# Patient Record
Sex: Female | Born: 1965 | Race: Black or African American | Hispanic: No
Health system: Southern US, Community
[De-identification: ages and names within clinical notes are randomized; demographics above are authoritative.]

## PROBLEM LIST (undated history)

## (undated) DIAGNOSIS — F419 Anxiety disorder, unspecified: Secondary | ICD-10-CM

## (undated) DIAGNOSIS — J40 Bronchitis, not specified as acute or chronic: Secondary | ICD-10-CM

## (undated) DIAGNOSIS — I10 Essential (primary) hypertension: Secondary | ICD-10-CM

## (undated) HISTORY — PX: ABDOMINAL HYSTERECTOMY: SHX81

## (undated) HISTORY — PX: LEG SURGERY: SHX1003

## (undated) HISTORY — PX: HYSTERECTOMY: SHX81

---

## 2001-08-15 ENCOUNTER — Encounter (INDEPENDENT_AMBULATORY_CARE_PROVIDER_SITE_OTHER): Payer: Self-pay | Admitting: *Deleted

## 2001-08-15 ENCOUNTER — Ambulatory Visit (HOSPITAL_BASED_OUTPATIENT_CLINIC_OR_DEPARTMENT_OTHER): Admission: RE | Admit: 2001-08-15 | Discharge: 2001-08-15 | Payer: Self-pay | Admitting: General Surgery

## 2001-08-27 ENCOUNTER — Encounter: Payer: Self-pay | Admitting: Oncology

## 2001-08-27 ENCOUNTER — Ambulatory Visit (HOSPITAL_COMMUNITY): Admission: RE | Admit: 2001-08-27 | Discharge: 2001-08-27 | Payer: Self-pay | Admitting: Oncology

## 2001-08-28 ENCOUNTER — Ambulatory Visit: Admission: RE | Admit: 2001-08-28 | Discharge: 2001-11-26 | Payer: Self-pay | Admitting: Radiation Oncology

## 2001-08-29 ENCOUNTER — Ambulatory Visit (HOSPITAL_COMMUNITY): Admission: RE | Admit: 2001-08-29 | Discharge: 2001-08-29 | Payer: Self-pay | Admitting: Oncology

## 2001-08-29 ENCOUNTER — Encounter: Payer: Self-pay | Admitting: Oncology

## 2001-09-02 ENCOUNTER — Encounter: Payer: Self-pay | Admitting: Oncology

## 2001-09-02 ENCOUNTER — Ambulatory Visit (HOSPITAL_COMMUNITY): Admission: RE | Admit: 2001-09-02 | Discharge: 2001-09-02 | Payer: Self-pay | Admitting: Oncology

## 2001-09-04 ENCOUNTER — Encounter: Payer: Self-pay | Admitting: Oncology

## 2001-09-04 ENCOUNTER — Ambulatory Visit (HOSPITAL_COMMUNITY): Admission: RE | Admit: 2001-09-04 | Discharge: 2001-09-04 | Payer: Self-pay | Admitting: Oncology

## 2005-02-28 ENCOUNTER — Emergency Department (HOSPITAL_COMMUNITY): Admission: EM | Admit: 2005-02-28 | Discharge: 2005-02-28 | Payer: Self-pay | Admitting: Emergency Medicine

## 2006-01-24 ENCOUNTER — Emergency Department (HOSPITAL_COMMUNITY): Admission: EM | Admit: 2006-01-24 | Discharge: 2006-01-24 | Payer: Self-pay | Admitting: Emergency Medicine

## 2008-03-29 ENCOUNTER — Emergency Department (HOSPITAL_COMMUNITY): Admission: EM | Admit: 2008-03-29 | Discharge: 2008-03-29 | Payer: Self-pay | Admitting: Emergency Medicine

## 2009-05-04 ENCOUNTER — Emergency Department (HOSPITAL_COMMUNITY): Admission: EM | Admit: 2009-05-04 | Discharge: 2009-05-05 | Payer: Self-pay | Admitting: Emergency Medicine

## 2010-10-06 NOTE — Op Note (Signed)
[place]  Patient:    Christina Glass, Christina Glass Visit Number: [id number] MRN: [id number]          Service Type: DSU Location: [facility] Attending Physician:  Arlis Porta Dictated by:   Adolph Pollack, M.D. Proc. Date: 08/15/01 Admit Date:  08/15/2001   CC:         Moshe Salisbury. Audria Nine, M.D.   Operative Report  PREOPERATIVE DIAGNOSIS:  Left lower extremity soft tissue mass.  POSTOPERATIVE DIAGNOSIS:  Left lower extremity soft tissue mass.  PROCEDURE:  Excision of 4.5 cm left lower extremity soft tissue mass.  SURGEON:  Adolph Pollack, M.D.  ANESTHESIA:  General.  INDICATIONS:  The patient is a 45 year old female who first presented to my office in February with a left lower extremity soft tissue mass.  It had some bluish discoloration at that time.  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  I thought maybe a hematoma had come back in a month and it was still present, so now she presents for elective excision.  She has chosen to have general anesthesia.  DESCRIPTION OF PROCEDURE:  The left lower extremity lesion was noted and quite evident in the pretibial area.  She is brought to the operating room and a general anesthetic was administered.  The left lower extremity was sterilely prepped and draped.  Local anesthetic was infiltrated directly over the wound in the mid pretibial area on the left side.  An incision was made directly over the mass and carried down through the subcutaneous tissue.  A fleshy-type mass was identified and using careful blunt and sharp dissection, it was excised.  There appeared to be a nerve branch ending in the mass.  I divided this feeder nerve with the cautery and passed the mass off the field to be sent fresh to pathology.  I inspected the wound.  The tibialis anterior muscle was exposed.  Bleeding points were controlled with the cautery.  I then closed the wound in a single layer with a running  locking 4-0 nylon suture.  A sterile dressing was applied.  She tolerated the procedure well without any apparent complications and was taken to the recovery room in satisfactory condition.  We will await the pathology report and I will see her back in the office in about 10 days. Dictated by:   Adolph Pollack, M.D. Attending Physician:  Arlis Porta DD:  08/15/01 TD:  08/17/01 Job: [id number]

## 2011-01-14 ENCOUNTER — Emergency Department (HOSPITAL_BASED_OUTPATIENT_CLINIC_OR_DEPARTMENT_OTHER)
Admission: EM | Admit: 2011-01-14 | Discharge: 2011-01-14 | Disposition: A | Discharge status: Home / Self Care | Payer: Self-pay | Attending: Emergency Medicine | Admitting: Emergency Medicine

## 2011-01-14 ENCOUNTER — Encounter: Payer: Self-pay | Admitting: *Deleted

## 2011-01-14 DIAGNOSIS — F411 Generalized anxiety disorder: Secondary | ICD-10-CM | POA: Insufficient documentation

## 2011-01-14 MED ORDER — LORAZEPAM 1 MG PO TABS: 1.0000 mg | ORAL_TABLET | Freq: Three times a day (TID) | ORAL | Status: AC | PRN
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
## 2011-01-22 ENCOUNTER — Encounter (HOSPITAL_BASED_OUTPATIENT_CLINIC_OR_DEPARTMENT_OTHER): Payer: Self-pay | Admitting: *Deleted

## 2011-01-22 ENCOUNTER — Emergency Department (HOSPITAL_BASED_OUTPATIENT_CLINIC_OR_DEPARTMENT_OTHER)
Admission: EM | Admit: 2011-01-22 | Discharge: 2011-01-22 | Disposition: A | Discharge status: Home / Self Care | Payer: Self-pay | Attending: Emergency Medicine | Admitting: Emergency Medicine

## 2011-01-22 DIAGNOSIS — H9209 Otalgia, unspecified ear: Secondary | ICD-10-CM | POA: Insufficient documentation

## 2011-01-22 DIAGNOSIS — F411 Generalized anxiety disorder: Secondary | ICD-10-CM | POA: Insufficient documentation

## 2011-01-22 HISTORY — DX: Anxiety disorder, unspecified: F41.9

## 2011-01-22 MED ORDER — LORAZEPAM 1 MG PO TABS: 1.0000 mg | ORAL_TABLET | Freq: Three times a day (TID) | ORAL | Status: AC | PRN

## 2011-01-22 NOTE — ED Notes (Signed)
Pt. In no distress and will be discharged home alone.

## 2011-01-22 NOTE — ED Provider Notes (Signed)
History     CSN: [id number] Arrival date & time: 01/22/2011 10:21 PM  Chief Complaint  Patient presents with  . Ear Fullness   HPI Comments: States has been stressed out since death of mother last week.  Was here for "thumping in her ear" and anxiety last week.  Was given Ativan which helped her nerves and helped her sleep.  It also made the thumping in her ear go away.  She believes stress is causing her symptoms.  Patient is a 45 y.o. female presenting with plugged ear sensation. The history is provided by the patient.  Ear Fullness This is a recurrent problem. The current episode started more than 1 week ago. The problem occurs constantly. The problem has not changed since onset.Pertinent negatives include no chest pain, no abdominal pain, no headaches and no shortness of breath. The symptoms are aggravated by stress.    Past Medical History  Diagnosis Date  . Anxiety     History reviewed. No pertinent past surgical history.  No family history on file.  History  Substance Use Topics  . Smoking status: Never Smoker   . Smokeless tobacco: Not on file  . Alcohol Use: No    OB History    Grav Para Term Preterm Abortions TAB SAB Ect Mult Living                  Review of Systems  HENT: Positive for ear pain.   Respiratory: Negative for shortness of breath.   Cardiovascular: Negative for chest pain.  Gastrointestinal: Negative for abdominal pain.  Neurological: Negative for headaches.  All other systems reviewed and are negative.    Physical Exam  BP 130/70  Pulse 94  Temp(Src) 98.3 F (36.8 C) (Oral)  Resp 20  SpO2 98%  Physical Exam  Constitutional: She is oriented to person, place, and time. She appears well-developed and well-nourished. No distress.  HENT:  Head: Normocephalic and atraumatic.  Right Ear: External ear normal.  Left Ear: External ear normal.  Mouth/Throat: Oropharynx is clear and moist.  Eyes: EOM are normal. Pupils are equal, round, and  reactive to light.  Neck: Normal range of motion. Neck supple.  Cardiovascular: Normal rate and regular rhythm.  Exam reveals no gallop and no friction rub.   No murmur heard. Pulmonary/Chest: Effort normal and breath sounds normal. No respiratory distress. She has no wheezes.  Abdominal: Soft. Bowel sounds are normal.  Musculoskeletal: Normal range of motion.  Lymphadenopathy:    She has no cervical adenopathy.  Neurological: She is alert and oriented to person, place, and time.  Skin: Skin is warm and dry. She is not diaphoretic.  Psychiatric: She has a normal mood and affect.    ED Course  Procedures  MDM Symptoms relieved with ativan before.  Will prescribe small quantity of same.  Explained that this can be habit forming and not to overuse.      Geoffery Lyons, MD 01/22/11 2248

## 2011-01-22 NOTE — ED Notes (Signed)
No distress noted in Pt. °

## 2011-01-22 NOTE — ED Notes (Signed)
Thumping in her left ear. Was seen for same a week ago and symptoms were relieved by anxiety medication we gave her. She ran out of medication 2 days ago and symptoms have returned.

## 2011-02-20 LAB — URINE MICROSCOPIC-ADD ON

## 2011-02-20 LAB — URINE CULTURE

## 2011-02-20 LAB — URINALYSIS, ROUTINE W REFLEX MICROSCOPIC
Nitrite: NEGATIVE
Specific Gravity, Urine: 1.017
Urobilinogen, UA: 1

## 2011-06-05 ENCOUNTER — Encounter (HOSPITAL_BASED_OUTPATIENT_CLINIC_OR_DEPARTMENT_OTHER): Payer: Self-pay | Admitting: Emergency Medicine

## 2011-06-05 ENCOUNTER — Emergency Department (INDEPENDENT_AMBULATORY_CARE_PROVIDER_SITE_OTHER): Payer: Self-pay

## 2011-06-05 ENCOUNTER — Emergency Department (HOSPITAL_BASED_OUTPATIENT_CLINIC_OR_DEPARTMENT_OTHER)
Admission: EM | Admit: 2011-06-05 | Discharge: 2011-06-05 | Disposition: A | Discharge status: Home / Self Care | Payer: Self-pay | Attending: Emergency Medicine | Admitting: Emergency Medicine

## 2011-06-05 DIAGNOSIS — R0602 Shortness of breath: Secondary | ICD-10-CM | POA: Insufficient documentation

## 2011-06-05 DIAGNOSIS — J069 Acute upper respiratory infection, unspecified: Secondary | ICD-10-CM | POA: Insufficient documentation

## 2011-06-05 DIAGNOSIS — R05 Cough: Secondary | ICD-10-CM

## 2011-06-05 DIAGNOSIS — R509 Fever, unspecified: Secondary | ICD-10-CM

## 2011-06-05 DIAGNOSIS — R51 Headache: Secondary | ICD-10-CM | POA: Insufficient documentation

## 2011-06-05 DIAGNOSIS — I517 Cardiomegaly: Secondary | ICD-10-CM

## 2011-06-05 DIAGNOSIS — R911 Solitary pulmonary nodule: Secondary | ICD-10-CM

## 2011-06-05 MED ORDER — BENZONATATE 100 MG PO CAPS: 100.0000 mg | ORAL_CAPSULE | Freq: Three times a day (TID) | ORAL | Status: AC

## 2011-06-05 NOTE — ED Provider Notes (Signed)
History     CSN: [id number]  Arrival date & time 06/05/11  2136   First MD Initiated Contact with Patient 06/05/11 2201      Chief Complaint  Patient presents with  . Cough  . Shortness of Breath  . Fever    (Consider location/radiation/quality/duration/timing/severity/associated sxs/prior treatment) HPI Comments: Pt with cough, runny nose, myalgia for 4 days.  Cough worsening.  No SOB.  Sore across chest from coughing  Patient is a 46 y.o. female presenting with cough, shortness of breath, and fever. The history is provided by the patient.  Cough This is a new problem. The current episode started more than 2 days ago. The problem occurs constantly. The problem has been gradually worsening. The cough is productive of sputum. Maximum temperature: subjective fever. Associated symptoms include ear congestion, headaches, rhinorrhea, sore throat and myalgias. Pertinent negatives include no chest pain, no chills and no shortness of breath. She has tried cough syrup for the symptoms. The treatment provided no relief. She is not a smoker.  Shortness of Breath  Associated symptoms include a fever, rhinorrhea, sore throat and cough. Pertinent negatives include no chest pain and no shortness of breath.  Fever Primary symptoms of the febrile illness include fever, headaches, cough and myalgias. Primary symptoms do not include fatigue, shortness of breath, abdominal pain, nausea, vomiting, diarrhea, arthralgias or rash.  The headache is not associated with weakness.  The myalgias are not associated with weakness.    Past Medical History  Diagnosis Date  . Anxiety     Past Surgical History  Procedure Date  . Leg surgery     No family history on file.  History  Substance Use Topics  . Smoking status: Never Smoker   . Smokeless tobacco: Not on file  . Alcohol Use: No    OB History    Grav Para Term Preterm Abortions TAB SAB Ect Mult Living                  Review of Systems    Constitutional: Positive for fever. Negative for chills, diaphoresis and fatigue.  HENT: Positive for sore throat, rhinorrhea and postnasal drip. Negative for congestion and sneezing.   Eyes: Negative.   Respiratory: Positive for cough. Negative for chest tightness and shortness of breath.   Cardiovascular: Negative for chest pain and leg swelling.  Gastrointestinal: Negative for nausea, vomiting, abdominal pain, diarrhea and blood in stool.  Genitourinary: Negative for frequency, hematuria, flank pain and difficulty urinating.  Musculoskeletal: Positive for myalgias. Negative for back pain and arthralgias.  Skin: Negative for rash.  Neurological: Positive for headaches. Negative for dizziness, speech difficulty, weakness and numbness.    Allergies  Ivp dye; Fish oil; and Shrimp  Home Medications   Current Outpatient Rx  Name Route Sig Dispense Refill  . PHENYLEPHRINE-APAP-GUAIFENESIN 10-650-400 MG/20ML PO LIQD Oral Take 15 mLs by mouth every 4 (four) hours as needed. For congestion    . BENZONATATE 100 MG PO CAPS Oral Take 1 capsule (100 mg total) by mouth every 8 (eight) hours. 21 capsule 0    BP 128/78  Pulse 99  Temp(Src) 99.6 F (37.6 C) (Oral)  Resp 18  Physical Exam  Constitutional: She is oriented to person, place, and time. She appears well-developed and well-nourished.  HENT:  Head: Normocephalic and atraumatic.  Right Ear: External ear normal.  Left Ear: External ear normal.  Nose: Nose normal.  Mouth/Throat: Oropharynx is clear and moist.  Eyes: Pupils are equal, round,  and reactive to light.  Neck: Normal range of motion. Neck supple.  Cardiovascular: Normal rate, regular rhythm and normal heart sounds.   Pulmonary/Chest: Effort normal and breath sounds normal. No respiratory distress. She has no wheezes. She has no rales. She exhibits no tenderness.  Abdominal: Soft. Bowel sounds are normal. There is no tenderness. There is no rebound and no guarding.   Musculoskeletal: Normal range of motion. She exhibits no edema.  Lymphadenopathy:    She has no cervical adenopathy.  Neurological: She is alert and oriented to person, place, and time.  Skin: Skin is warm and dry. No rash noted.  Psychiatric: She has a normal mood and affect.    ED Course  Procedures (including critical care time)  Dg Chest 2 View  06/05/2011  *RADIOLOGY REPORT*  Clinical Data: Cough, fever  CHEST - 2 VIEW  Comparison: 05/04/2009  Findings: Mild cardiomegaly without CHF, pneumonia, edema, effusion, collapse, consolidation, pneumothorax.  Stable focal right upper lobe sub centimeter nodular density, suspect prominent vascular marking versus small granuloma.  Trachea is midline.  IMPRESSION: Cardiomegaly without CHF or pneumonia.  Original Report Authenticated By: Judie Petit. Ruel Favors, M.D.       1. URI (upper respiratory infection)       MDM  Pt with URI, no evidence of pneumonia.  No hypoxia.  Nothing to suggest PE        Rolan Bucco, MD 06/05/11 2233

## 2011-06-05 NOTE — ED Notes (Signed)
Pt c/o shob, cough, fever since sun

## 2011-06-05 NOTE — ED Notes (Signed)
Pt also reports headache and sore throat

## 2011-07-03 ENCOUNTER — Emergency Department (INDEPENDENT_AMBULATORY_CARE_PROVIDER_SITE_OTHER): Payer: Self-pay

## 2011-07-03 ENCOUNTER — Emergency Department (HOSPITAL_BASED_OUTPATIENT_CLINIC_OR_DEPARTMENT_OTHER)
Admission: EM | Admit: 2011-07-03 | Discharge: 2011-07-03 | Disposition: A | Discharge status: Home / Self Care | Payer: Self-pay | Attending: Emergency Medicine | Admitting: Emergency Medicine

## 2011-07-03 ENCOUNTER — Encounter (HOSPITAL_BASED_OUTPATIENT_CLINIC_OR_DEPARTMENT_OTHER): Payer: Self-pay | Admitting: Emergency Medicine

## 2011-07-03 DIAGNOSIS — R059 Cough, unspecified: Secondary | ICD-10-CM | POA: Insufficient documentation

## 2011-07-03 DIAGNOSIS — R51 Headache: Secondary | ICD-10-CM

## 2011-07-03 DIAGNOSIS — R05 Cough: Secondary | ICD-10-CM

## 2011-07-03 DIAGNOSIS — J40 Bronchitis, not specified as acute or chronic: Secondary | ICD-10-CM | POA: Insufficient documentation

## 2011-07-03 MED ORDER — ALBUTEROL SULFATE HFA 108 (90 BASE) MCG/ACT IN AERS
2.0000 | INHALATION_SPRAY | RESPIRATORY_TRACT | Status: DC | PRN
  Administered 2011-07-03: 2 via RESPIRATORY_TRACT
  Filled 2011-07-03: qty 6.7

## 2011-07-03 MED ORDER — HYDROCOD POLST-CHLORPHEN POLST 10-8 MG/5ML PO LQCR: ORAL | Status: DC

## 2011-07-03 MED ORDER — AZITHROMYCIN 250 MG PO TABS: ORAL_TABLET | ORAL | Status: DC

## 2011-07-03 NOTE — Discharge Instructions (Signed)
Bronchitis Bronchitis is the body's way of reacting to injury and/or infection (inflammation) of the bronchi. Bronchi are the air tubes that extend from the windpipe into the lungs. If the inflammation becomes severe, it may cause shortness of breath. CAUSES  Inflammation may be caused by:  A virus.   Germs (bacteria).   Dust.   Allergens.   Pollutants and many other irritants.  The cells lining the bronchial tree are covered with tiny hairs (cilia). These constantly beat upward, away from the lungs, toward the mouth. This keeps the lungs free of pollutants. When these cells become too irritated and are unable to do their job, mucus begins to develop. This causes the characteristic cough of bronchitis. The cough clears the lungs when the cilia are unable to do their job. Without either of these protective mechanisms, the mucus would settle in the lungs. Then you would develop pneumonia. Smoking is a common cause of bronchitis and can contribute to pneumonia. Stopping this habit is the single most important thing you can do to help yourself. TREATMENT   Your caregiver may prescribe an antibiotic if the cough is caused by bacteria. Also, medicines that open up your airways make it easier to breathe. Your caregiver may also recommend or prescribe an expectorant. It will loosen the mucus to be coughed up. Only take over-the-counter or prescription medicines for pain, discomfort, or fever as directed by your caregiver.   Removing whatever causes the problem (smoking, for example) is critical to preventing the problem from getting worse.   Cough suppressants may be prescribed for relief of cough symptoms.   Inhaled medicines may be prescribed to help with symptoms now and to help prevent problems from returning.   For those with recurrent (chronic) bronchitis, there may be a need for steroid medicines.  SEEK IMMEDIATE MEDICAL CARE IF:   During treatment, you develop more pus-like mucus  (purulent sputum).   You have a fever.   You become progressively more ill.   You have increased difficulty breathing, wheezing, or shortness of breath.   MAKE SURE YOU:   Understand these instructions.   Will watch your condition.   Will get help right away if you are not doing well or get worse.  Document Released: 05/07/2005 Document Revised: 01/17/2011 Document Reviewed: 03/16/2008 [facility] Patient Information 2012 [place], [place].

## 2011-07-03 NOTE — ED Notes (Signed)
Pt c/o cough since 06/05/11.

## 2011-07-03 NOTE — ED Provider Notes (Signed)
History     CSN: [id number]  Arrival date & time 07/03/11  0038   First MD Initiated Contact with Patient 07/03/11 0058      Chief Complaint  Patient presents with  . Cough    (Consider location/radiation/quality/duration/timing/severity/associated sxs/prior treatment) HPI This is a 46 year old white female with about a one-month history of cough. She was treated on January 15 with Tessalon Perles. Her other URI symptoms at that time have resolved but the cough lingers. It is worse at night. She describes it as moderate to severe. There's been no posttussive emesis. She is not having any fevers. The cough is rarely productive. She is taking over-the-counter phenylephrine guaifenesin without relief. She states she sometimes wheezes at night. She has never used an inhaler.  Past Medical History  Diagnosis Date  . Anxiety     Past Surgical History  Procedure Date  . Leg surgery     No family history on file.  History  Substance Use Topics  . Smoking status: Never Smoker   . Smokeless tobacco: Not on file  . Alcohol Use: No    OB History    Grav Para Term Preterm Abortions TAB SAB Ect Mult Living                  Review of Systems  All other systems reviewed and are negative.    Allergies  Ivp dye; Fish oil; and Shrimp  Home Medications   Current Outpatient Rx  Name Route Sig Dispense Refill  . PHENYLEPHRINE-APAP-GUAIFENESIN 10-650-400 MG/20ML PO LIQD Oral Take 15 mLs by mouth every 4 (four) hours as needed. For congestion      BP 145/84  Pulse 96  Temp(Src) 98.1 F (36.7 C) (Oral)  Resp 18  Ht 5\' 3"  (1.6 m)  Wt 250 lb (113.399 kg)  BMI 44.29 kg/m2  SpO2 99%  Physical Exam General: Well-developed, well-nourished female in no acute distress; appearance consistent with age of record HENT: normocephalic, atraumatic Eyes: pupils equal round and reactive to light; extraocular muscles intact Neck: supple Heart: regular rate and rhythm Lungs: clear to  auscultation bilaterally; constant dry cough Abdomen: soft; nondistended; nontender Extremities: No deformity; full range of motion Neurologic: Awake, alert and oriented; motor function intact in all extremities and symmetric; no facial droop Skin: Warm and dry; multiple scattered neurofibromas Psychiatric: Normal mood and affect    ED Course  Procedures (including critical care time)     MDM   Nursing notes and vitals signs, including pulse oximetry, reviewed.  Summary of this visit's results, reviewed by myself:  Imaging Studies: Dg Chest 2 View  07/03/2011  *RADIOLOGY REPORT*  Clinical Data: Persistent cough and headache.  CHEST - 2 VIEW  Comparison: 06/05/2011.  Findings: The heart is mildly enlarged.  The lungs are clear.  The visualized soft tissues and bony thorax are unremarkable.  IMPRESSION: Negative chest.  Original Report Authenticated By: Jamesetta Orleans. MATTERN, M.D.   1:56 AM Cough improved after albuterol treatment.        Hanley Seamen, MD 07/03/11 [phone]

## 2011-09-15 ENCOUNTER — Encounter (HOSPITAL_BASED_OUTPATIENT_CLINIC_OR_DEPARTMENT_OTHER): Payer: Self-pay | Admitting: *Deleted

## 2011-09-15 ENCOUNTER — Emergency Department (HOSPITAL_BASED_OUTPATIENT_CLINIC_OR_DEPARTMENT_OTHER)
Admission: EM | Admit: 2011-09-15 | Discharge: 2011-09-15 | Disposition: A | Discharge status: Home / Self Care | Payer: Self-pay | Attending: Emergency Medicine | Admitting: Emergency Medicine

## 2011-09-15 DIAGNOSIS — L309 Dermatitis, unspecified: Secondary | ICD-10-CM

## 2011-09-15 DIAGNOSIS — L259 Unspecified contact dermatitis, unspecified cause: Secondary | ICD-10-CM | POA: Insufficient documentation

## 2011-09-15 MED ORDER — DIPHENHYDRAMINE HCL 50 MG/ML IJ SOLN
50.0000 mg | Freq: Once | INTRAMUSCULAR | Status: AC
  Administered 2011-09-15: 50 mg via INTRAMUSCULAR
  Filled 2011-09-15: qty 1

## 2011-09-15 MED ORDER — HYDROXYZINE HCL 25 MG PO TABS: 25.0000 mg | ORAL_TABLET | Freq: Four times a day (QID) | ORAL | Status: AC

## 2011-09-15 MED ORDER — PREDNISONE 10 MG PO TABS
60.0000 mg | ORAL_TABLET | Freq: Once | ORAL | Status: AC
  Administered 2011-09-15: 60 mg via ORAL
  Filled 2011-09-15: qty 1

## 2011-09-15 MED ORDER — PREDNISONE 50 MG PO TABS: 50.0000 mg | ORAL_TABLET | Freq: Every day | ORAL | Status: AC

## 2011-09-15 NOTE — ED Provider Notes (Signed)
History   This chart was scribed for Celene Kras, MD by Charolett Bumpers . The patient was seen in room MH05/MH05 and the patient's care was started at 5:43pm.    CSN: [id number]  Arrival date & time 09/15/11  1726   First MD Initiated Contact with Patient 09/15/11 1741      Chief Complaint  Patient presents with  . Rash     HPI Christina Glass is a 46 y.o. female who presents to the Emergency Department complaining of constant, moderate rash on her lower right extremity. Patient reports associated itching and redness. Patient states that she took Benadryl orally as well as used a Benadryl cream and cortizone cream with no relief. Patient states that the redness started today. Patient states that her symptoms are worsening. Patient denies any new detergents. Patient notes that she received a pedicure prior to the symptoms onset. No other symptoms reported. No pertinent medical history reported.    Past Medical History  Diagnosis Date  . Anxiety   . Neurofibromatosis     Past Surgical History  Procedure Date  . Leg surgery     History reviewed. No pertinent family history.  History  Substance Use Topics  . Smoking status: Never Smoker   . Smokeless tobacco: Not on file  . Alcohol Use: No    OB History    Grav Para Term Preterm Abortions TAB SAB Ect Mult Living                  Review of Systems  Constitutional: Negative for fever.  HENT: Negative for congestion and sore throat.   Respiratory: Negative for cough and shortness of breath.   Skin: Positive for rash.    Allergies  Ivp dye; Fish oil; and Shrimp  Home Medications   Current Outpatient Rx  Name Route Sig Dispense Refill  . AZITHROMYCIN 250 MG PO TABS  Take 2 tablets today then one tablet daily for 4 days. 6 tablet 0  . HYDROCOD POLST-CPM POLST ER 10-8 MG/5ML PO LQCR  Take 5 mL every 12 hours as needed for cough. 200 mL 0  . PHENYLEPHRINE-APAP-GUAIFENESIN 10-650-400 MG/20ML PO LIQD Oral Take  15 mLs by mouth every 4 (four) hours as needed. For congestion      BP 144/80  Pulse 83  Temp(Src) 98.1 F (36.7 C) (Oral)  Resp 16  Ht 5\' 2"  (1.575 m)  Wt 190 lb (86.183 kg)  BMI 34.75 kg/m2  SpO2 100%  Physical Exam  Nursing note and vitals reviewed. Constitutional: She appears well-developed and well-nourished. No distress.  HENT:  Head: Normocephalic and atraumatic.  Right Ear: External ear normal.  Left Ear: External ear normal.  Eyes: Conjunctivae are normal. Right eye exhibits no discharge. Left eye exhibits no discharge. No scleral icterus.  Neck: Neck supple. No tracheal deviation present.  Cardiovascular: Normal rate, regular rhythm and normal heart sounds.   Pulmonary/Chest: Effort normal and breath sounds normal. No stridor. No respiratory distress. She has no wheezes.  Musculoskeletal: She exhibits no edema.       neurofibromatosis lesions noted on extrem, face  Neurological: She is alert. Cranial nerve deficit: no gross deficits.  Skin: Skin is warm and dry. Rash noted. No purpura noted. Rash is macular. Rash is not pustular. There is erythema.       RLE with erythematous macular rash. Few areas with serous drainage. Excursions noted.    Psychiatric: She has a normal mood and affect.  ED Course  Procedures (including critical care time)  Medications  hydrOXYzine (ATARAX/VISTARIL) 25 MG tablet (not administered)  predniSONE (DELTASONE) 50 MG tablet (not administered)  diphenhydrAMINE (BENADRYL) injection 50 mg (50 mg Intramuscular Given 09/15/11 1801)  predniSONE (DELTASONE) tablet 60 mg (60 mg Oral Given 09/15/11 1802)    DIAGNOSTIC STUDIES: Oxygen Saturation is 100% on room air, normal by my interpretation.    COORDINATION OF CARE:  1747: Discussed planned course of treatment with the patient who is agreeable at this time.  1800: Medication Orders: Prednisone (Deltasone) tablet 60 mg-once; Diphenhydramine (Benadryl) injection 50 mg-once.   Labs  Reviewed - No data to display No results found.  1. Dermatitis     MDM  Doubt cellulitis.  Pt did get a pedicure prior to this happening.  There seems to be a demargination line by her ankle.  Suspect some type of contact dermatitis.  Will dc home on steroids and antihistamines.  Explained to patient to monitor for fever, pain. I personally performed the services described in this documentation, which was scribed in my presence.  The recorded information has been reviewed and considered.      Celene Kras, MD 09/15/11 [phone]

## 2011-09-15 NOTE — ED Notes (Signed)
Pt states she noticed her right lower ext itching yesterday and it has gotten progressively worse.

## 2011-09-15 NOTE — Discharge Instructions (Signed)
Contact Dermatitis  Contact dermatitis is a rash that happens when something touches the skin. You touched something that irritates your skin, or you have allergies to something you touched.  HOME CARE    Avoid the thing that caused your rash.   Keep your rash away from hot water, soap, sunlight, chemicals, and other things that might bother it.   Do not scratch your rash.   You can take cool baths to help stop itching.   Only take medicine as told by your doctor.   Keep all doctor visits as told.  GET HELP RIGHT AWAY IF:    Your rash is not better after 3 days.   Your rash gets worse.   Your rash is puffy (swollen), tender, red, sore, or warm.   You have problems with your medicine.  MAKE SURE YOU:    Understand these instructions.   Will watch your condition.   Will get help right away if you are not doing well or get worse.  Document Released: 03/04/2009 Document Revised: 04/26/2011 Document Reviewed: 10/10/2010  Exit[employer] Patient Information 2012 [employer].

## 2011-10-11 ENCOUNTER — Encounter (HOSPITAL_BASED_OUTPATIENT_CLINIC_OR_DEPARTMENT_OTHER): Payer: Self-pay | Admitting: Family Medicine

## 2011-10-11 ENCOUNTER — Emergency Department (HOSPITAL_BASED_OUTPATIENT_CLINIC_OR_DEPARTMENT_OTHER)
Admission: EM | Admit: 2011-10-11 | Discharge: 2011-10-11 | Disposition: A | Discharge status: Home / Self Care | Payer: Self-pay | Attending: Emergency Medicine | Admitting: Emergency Medicine

## 2011-10-11 DIAGNOSIS — L509 Urticaria, unspecified: Secondary | ICD-10-CM | POA: Insufficient documentation

## 2011-10-11 DIAGNOSIS — R21 Rash and other nonspecific skin eruption: Secondary | ICD-10-CM | POA: Insufficient documentation

## 2011-10-11 DIAGNOSIS — L298 Other pruritus: Secondary | ICD-10-CM | POA: Insufficient documentation

## 2011-10-11 DIAGNOSIS — L2989 Other pruritus: Secondary | ICD-10-CM | POA: Insufficient documentation

## 2011-10-11 MED ORDER — HYDROXYZINE HCL 25 MG PO TABS: 25.0000 mg | ORAL_TABLET | Freq: Four times a day (QID) | ORAL | Status: DC

## 2011-10-11 MED ORDER — PREDNISONE 10 MG PO TABS: 40.0000 mg | ORAL_TABLET | Freq: Every day | ORAL | Status: DC

## 2011-10-11 MED ORDER — HYDROXYZINE HCL 25 MG PO TABS: 25.0000 mg | ORAL_TABLET | Freq: Four times a day (QID) | ORAL | Status: AC

## 2011-10-11 MED ORDER — DEXAMETHASONE SODIUM PHOSPHATE 10 MG/ML IJ SOLN
10.0000 mg | Freq: Once | INTRAMUSCULAR | Status: AC
  Administered 2011-10-11: 10 mg via INTRAMUSCULAR
  Filled 2011-10-11: qty 1

## 2011-10-11 MED ORDER — DIPHENHYDRAMINE HCL 50 MG/ML IJ SOLN
50.0000 mg | Freq: Once | INTRAMUSCULAR | Status: AC
  Administered 2011-10-11: 50 mg via INTRAMUSCULAR
  Filled 2011-10-11: qty 1

## 2011-10-11 NOTE — ED Provider Notes (Signed)
History     CSN: [id number]  Arrival date & time 10/11/11  1139   First MD Initiated Contact with Patient 10/11/11 1156      Chief Complaint  Patient presents with  . Rash    (Consider location/radiation/quality/duration/timing/severity/associated sxs/prior treatment) HPI Comments: Patient presents with a pruritic rash on her chest and abdomen. That started 4 days ago, but got rapidly worse yesterday. She denies any new exposures. Denies any recent new medications. She has allergies to IVP dye and seafood. However, denies any other allergies or allergic reactions. She denies a facial swelling or swelling of her lips or tongue. Denies any shortness of breath. She's been taking Benadryl with no relief. She had a similar type rash on her legs about a month ago and was seen here in the emergency department and prescribed Atarax and steroids. She did improve after that and then the rash just started back again. A couple days ago.  Patient is a 46 y.o. female presenting with rash. The history is provided by the patient.  Rash  This is a new problem. The current episode started yesterday. The problem has been gradually worsening. The problem is associated with nothing. There has been no fever. The rash is present on the trunk. The patient is experiencing no pain. Associated symptoms include itching. Pertinent negatives include no blisters.    Past Medical History  Diagnosis Date  . Anxiety   . Neurofibromatosis     Past Surgical History  Procedure Date  . Leg surgery     No family history on file.  History  Substance Use Topics  . Smoking status: Never Smoker   . Smokeless tobacco: Not on file  . Alcohol Use: No    OB History    Grav Para Term Preterm Abortions TAB SAB Ect Mult Living                  Review of Systems  Constitutional: Negative for fever, chills, diaphoresis and fatigue.  HENT: Negative for congestion, rhinorrhea and sneezing.   Eyes: Negative.     Respiratory: Negative for cough, chest tightness and shortness of breath.   Cardiovascular: Negative for chest pain and leg swelling.  Gastrointestinal: Negative for nausea, vomiting, abdominal pain, diarrhea and blood in stool.  Genitourinary: Negative for frequency, hematuria, flank pain and difficulty urinating.  Musculoskeletal: Negative for back pain and arthralgias.  Skin: Positive for itching and rash.  Neurological: Negative for dizziness, speech difficulty, weakness, numbness and headaches.    Allergies  Ivp dye; Fish oil; and Shrimp  Home Medications   Current Outpatient Rx  Name Route Sig Dispense Refill  . BENADRYL ALLERGY PO Oral Take by mouth.    Marland Kitchen HYDROXYZINE HCL 25 MG PO TABS Oral Take 1 tablet (25 mg total) by mouth every 6 (six) hours. 12 tablet 0  . PREDNISONE 10 MG PO TABS Oral Take 4 tablets (40 mg total) by mouth daily. 20 tablet 0    BP 126/84  Pulse 84  Temp(Src) 98.4 F (36.9 C) (Oral)  Resp 16  SpO2 97%  Physical Exam  Constitutional: She is oriented to person, place, and time. She appears well-developed and well-nourished.  HENT:  Head: Normocephalic and atraumatic.  Eyes: Pupils are equal, round, and reactive to light.  Neck: Normal range of motion. Neck supple.  Cardiovascular: Normal rate, regular rhythm and normal heart sounds.   Pulmonary/Chest: Effort normal and breath sounds normal. No respiratory distress. She has no wheezes. She has  no rales. She exhibits no tenderness.  Abdominal: Soft. Bowel sounds are normal. There is no tenderness. There is no rebound and no guarding.  Musculoskeletal: Normal range of motion. She exhibits no edema.  Lymphadenopathy:    She has no cervical adenopathy.  Neurological: She is alert and oriented to person, place, and time.  Skin: Skin is warm and dry. No rash noted.       Urticarial type rash on the chest and abdomen. It is blanching. There is no vesicular lesions. No petechiae or purpura.  Psychiatric:  She has a normal mood and affect.    ED Course  Procedures (including critical care time)  Labs Reviewed - No data to display No results found.   1. Urticaria       MDM  Pt with allergic appearing rash.  No airway involvement.  No signs of infection.  Will give steroids/atarax.  Encouraged f/u with dermatologist        Rolan Bucco, MD 10/11/11 1245

## 2011-10-11 NOTE — ED Notes (Signed)
Pt c/o rash "all over" and itching x 4 days. Pt reports h/o similar rash.

## 2011-10-11 NOTE — Discharge Instructions (Signed)
Hives Hives (urticaria) are itchy, red, swollen patches on the skin. They may change size, shape, and location quickly and repeatedly. Hives that occur deeper in the skin can cause swelling of the hands, feet, and face. Hives may be an allergic reaction to something you or your child ate, touched, or put on the skin. Hives can also be a reaction to cold, heat, viral infections, medication, insect bites, or emotional stress. Often the cause is hard to find. Hives can come and go for several days to several weeks. Hives are not contagious. HOME CARE INSTRUCTIONS   If the cause of the hives is known, avoid exposure to that source.   To relieve itching and rash:   Apply cold compresses to the skin or take cool water baths. Do not take or give your child hot baths or showers because the warmth will make the itching worse.   The best medicine for hives is an antihistamine. An antihistamine will not cure hives, but it will reduce their severity. You can use an antihistamine available over the counter. This medicine may make your child sleepy. Teenagers should not drive while using this medicine.   Take or give an antihistamine every 6 hours until the hives are completely gone for 24 hours or as directed.   Your child may have other medications prescribed for itching. Give these as directed by your child's caregiver.   You or your child should wear loose fitting clothing, including undergarments. Skin irritations may make hives worse.   Follow-up as directed by your caregiver.  SEEK MEDICAL CARE IF:   You or your child still have considerable itching after taking the medication (prescribed or purchased over the counter).   Joint swelling or pain occurs.  SEEK IMMEDIATE MEDICAL CARE IF:   You have a fever.   Swollen lips or tongue are noticed.   There is difficulty with breathing, swallowing, or tightness in the throat or chest.   Abdominal pain develops.   Your child starts acting very  sick.  These may be the first signs of a life-threatening allergic reaction. THIS IS AN EMERGENCY. Call 911 for medical help. MAKE SURE YOU:   Understand these instructions.   Will watch your condition.   Will get help right away if you are not doing well or get worse.  Document Released: 05/07/2005 Document Revised: 04/26/2011 Document Reviewed: 12/26/2007 [employer] Patient Information 2012 [employer]. 

## 2012-01-24 ENCOUNTER — Emergency Department (HOSPITAL_BASED_OUTPATIENT_CLINIC_OR_DEPARTMENT_OTHER)
Admission: EM | Admit: 2012-01-24 | Discharge: 2012-01-24 | Disposition: A | Discharge status: Home / Self Care | Payer: Self-pay | Attending: Emergency Medicine | Admitting: Emergency Medicine

## 2012-01-24 ENCOUNTER — Encounter (HOSPITAL_BASED_OUTPATIENT_CLINIC_OR_DEPARTMENT_OTHER): Payer: Self-pay | Admitting: *Deleted

## 2012-01-24 ENCOUNTER — Emergency Department (HOSPITAL_BASED_OUTPATIENT_CLINIC_OR_DEPARTMENT_OTHER): Payer: Self-pay

## 2012-01-24 DIAGNOSIS — M25519 Pain in unspecified shoulder: Secondary | ICD-10-CM | POA: Insufficient documentation

## 2012-01-24 DIAGNOSIS — S46912A Strain of unspecified muscle, fascia and tendon at shoulder and upper arm level, left arm, initial encounter: Secondary | ICD-10-CM

## 2012-01-24 MED ORDER — IBUPROFEN 800 MG PO TABS: 800.0000 mg | ORAL_TABLET | Freq: Three times a day (TID) | ORAL | Status: AC

## 2012-01-24 MED ORDER — HYDROCODONE-ACETAMINOPHEN 5-325 MG PO TABS: 2.0000 | ORAL_TABLET | ORAL | Status: AC | PRN

## 2012-01-24 MED ORDER — HYDROCODONE-ACETAMINOPHEN 5-325 MG PO TABS
2.0000 | ORAL_TABLET | Freq: Once | ORAL | Status: AC
  Administered 2012-01-24: 2 via ORAL
  Filled 2012-01-24: qty 2

## 2012-01-24 NOTE — ED Notes (Signed)
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

## 2012-01-24 NOTE — ED Provider Notes (Signed)
History     CSN: [id number]  Arrival date & time 01/24/12  1628   First MD Initiated Contact with Patient 01/24/12 1655      Chief Complaint  Patient presents with  . Shoulder Pain    (Consider location/radiation/quality/duration/timing/severity/associated sxs/prior treatment) Patient is a 46 y.o. female presenting with shoulder injury. The history is provided by the patient. No language interpreter was used.  Shoulder Injury This is a new problem. Episode onset: 3 days ago. The problem occurs constantly. The problem has been gradually worsening. Associated symptoms include joint swelling. Nothing aggravates the symptoms. She has tried nothing for the symptoms. The treatment provided moderate relief.  Pt reports she injured her shoulder 3 days ago while lifting a pt.  Pt complains of pain with lifting  Past Medical History  Diagnosis Date  . Anxiety   . Neurofibromatosis     Past Surgical History  Procedure Date  . Leg surgery     History reviewed. No pertinent family history.  History  Substance Use Topics  . Smoking status: Never Smoker   . Smokeless tobacco: Not on file  . Alcohol Use: No    OB History    Grav Para Term Preterm Abortions TAB SAB Ect Mult Living                  Review of Systems  Musculoskeletal: Positive for joint swelling.  All other systems reviewed and are negative.    Allergies  Ivp dye; Fish oil; and Shrimp  Home Medications  No current outpatient prescriptions on file.  BP 124/70  Pulse 72  Temp 98.4 F (36.9 C) (Oral)  Resp 20  SpO2 100%  Physical Exam  Nursing note and vitals reviewed. Constitutional: She is oriented to person, place, and time. She appears well-developed and well-nourished.  HENT:  Head: Normocephalic and atraumatic.  Neck: Normal range of motion. Neck supple.  Cardiovascular: Normal rate.   Pulmonary/Chest: Effort normal.  Musculoskeletal: She exhibits tenderness.  Neurological: She is alert and  oriented to person, place, and time. She has normal reflexes.  Skin: Skin is warm.  Psychiatric: She has a normal mood and affect.    ED Course  Procedures (including critical care time)  Labs Reviewed - No data to display Dg Shoulder Right  01/24/2012  *RADIOLOGY REPORT*  Clinical Data: Right shoulder pain  RIGHT SHOULDER - 2+ VIEW  Comparison: None.  Findings: Normal alignment without fracture.  AC joint aligned. Visualized right ribs intact.  Right lung clear.  IMPRESSION: No acute finding.   Original Report Authenticated By: Judie Petit. Ruel Favors, M.D.      No diagnosis found.    MDM  Pt given 2 hydrocodone for discomfort.  I advised call Dr. Pearletha Forge to be seen for evaluation Pt given RX for hydrocodone and ibuprofen       Lonia Skinner [place], [place] 01/24/12 1750

## 2012-01-26 NOTE — ED Provider Notes (Signed)
Medical screening examination/treatment/procedure(s) were performed by non-physician practitioner and as supervising physician I was immediately available for consultation/collaboration.   Cyndra Numbers, MD 01/26/12 [phone]

## 2012-01-30 ENCOUNTER — Encounter (HOSPITAL_BASED_OUTPATIENT_CLINIC_OR_DEPARTMENT_OTHER): Payer: Self-pay | Admitting: Emergency Medicine

## 2012-01-30 ENCOUNTER — Emergency Department (HOSPITAL_BASED_OUTPATIENT_CLINIC_OR_DEPARTMENT_OTHER)
Admission: EM | Admit: 2012-01-30 | Discharge: 2012-01-30 | Disposition: A | Discharge status: Home / Self Care | Payer: Self-pay | Attending: Emergency Medicine | Admitting: Emergency Medicine

## 2012-01-30 DIAGNOSIS — B029 Zoster without complications: Secondary | ICD-10-CM | POA: Insufficient documentation

## 2012-01-30 MED ORDER — HYDROCODONE-ACETAMINOPHEN 5-325 MG PO TABS: 1.0000 | ORAL_TABLET | Freq: Four times a day (QID) | ORAL | Status: AC | PRN

## 2012-01-30 MED ORDER — ACYCLOVIR 800 MG PO TABS: ORAL_TABLET | ORAL | Status: DC

## 2012-01-30 NOTE — Discharge Instructions (Signed)
Return here as needed. Follow up with your doctor. °

## 2012-01-30 NOTE — ED Provider Notes (Signed)
History     CSN: [id number]  Arrival date & time 01/30/12  2044   First MD Initiated Contact with Patient 01/30/12 2120      Chief Complaint  Patient presents with  . Shoulder Pain  . Allergic Reaction    (Consider location/radiation/quality/duration/timing/severity/associated sxs/prior treatment) HPI Patient presents emergency department with continued right shoulder pain, and rash.  Patient, states, that she was seen here one week ago and treated for shoulder strain.  Patient, states, that 3 days later, started developing a rash to her shoulder and chest and back.  Patient, states the rash looks a little blistered areas in clusters.  Patient has nausea, vomiting, fever, chest pain, shortness of breath, or weakness.  Patient, states the pain medicines relieve the pain, but then once the medicine wore off the pain would return.  Past Medical History  Diagnosis Date  . Anxiety   . Neurofibromatosis     Past Surgical History  Procedure Date  . Leg surgery     History reviewed. No pertinent family history.  History  Substance Use Topics  . Smoking status: Never Smoker   . Smokeless tobacco: Not on file  . Alcohol Use: No    OB History    Grav Para Term Preterm Abortions TAB SAB Ect Mult Living                  Review of Systems All other systems negative except as documented in the HPI. All pertinent positives and negatives as reviewed in the HPI.  Allergies  Ivp dye; Fish oil; and Shrimp  Home Medications   Current Outpatient Rx  Name Route Sig Dispense Refill  . IBUPROFEN 800 MG PO TABS Oral Take 1 tablet (800 mg total) by mouth 3 (three) times daily. 21 tablet 0  . HYDROCODONE-ACETAMINOPHEN 5-325 MG PO TABS Oral Take 2 tablets by mouth every 4 (four) hours as needed for pain. 16 tablet 0    BP 131/80  Pulse 88  Temp 98.4 F (36.9 C) (Oral)  Resp 20  SpO2 98%  Physical Exam  Constitutional: She appears well-developed and well-nourished. No distress.    HENT:  Mouth/Throat: Oropharynx is clear and moist.  Eyes: Pupils are equal, round, and reactive to light.  Cardiovascular: Normal rate and regular rhythm.   Pulmonary/Chest: Effort normal and breath sounds normal.  Musculoskeletal:       Arms: Skin: Skin is warm and dry. Rash noted.    ED Course  Procedures (including critical care time) Patient be treated herpes zoster, based on her rash and clinical presentation.  Patient is advised return here for any worsening in her condition.   MDM          Carlyle Dolly, PA-C 01/30/12 2146  Carlyle Dolly, PA-C 02/05/12 2033

## 2012-01-30 NOTE — ED Notes (Signed)
Pt states she used biofreeze  2 days ago and patchy blister areas appeared after using the cream

## 2012-01-30 NOTE — ED Notes (Signed)
Pt  C/o right shoulder pain, was seen here last week and diagnosed with muscle strain, was given vicodin and ibuprofen, pain has not improved in right shoulder, developed patchy blister  Areas to right shoulder only 2 days agos,

## 2012-02-13 NOTE — ED Provider Notes (Signed)
Medical screening examination/treatment/procedure(s) were conducted as a shared visit with non-physician practitioner(s) and myself.  I personally evaluated the patient during the encounter  Pt with findings c/w zoster.  Otherwise well appearing, stable for d/c  Joya Gaskins, MD 02/13/12 1355

## 2012-05-16 IMAGING — CR DG CHEST 2V
2 series · 2 of 2 positions shown · non-contrast
Comparison: 06/05/2011.

CLINICAL DATA: Persistent cough and headache.

CHEST - 2 VIEW

[w chest pa]
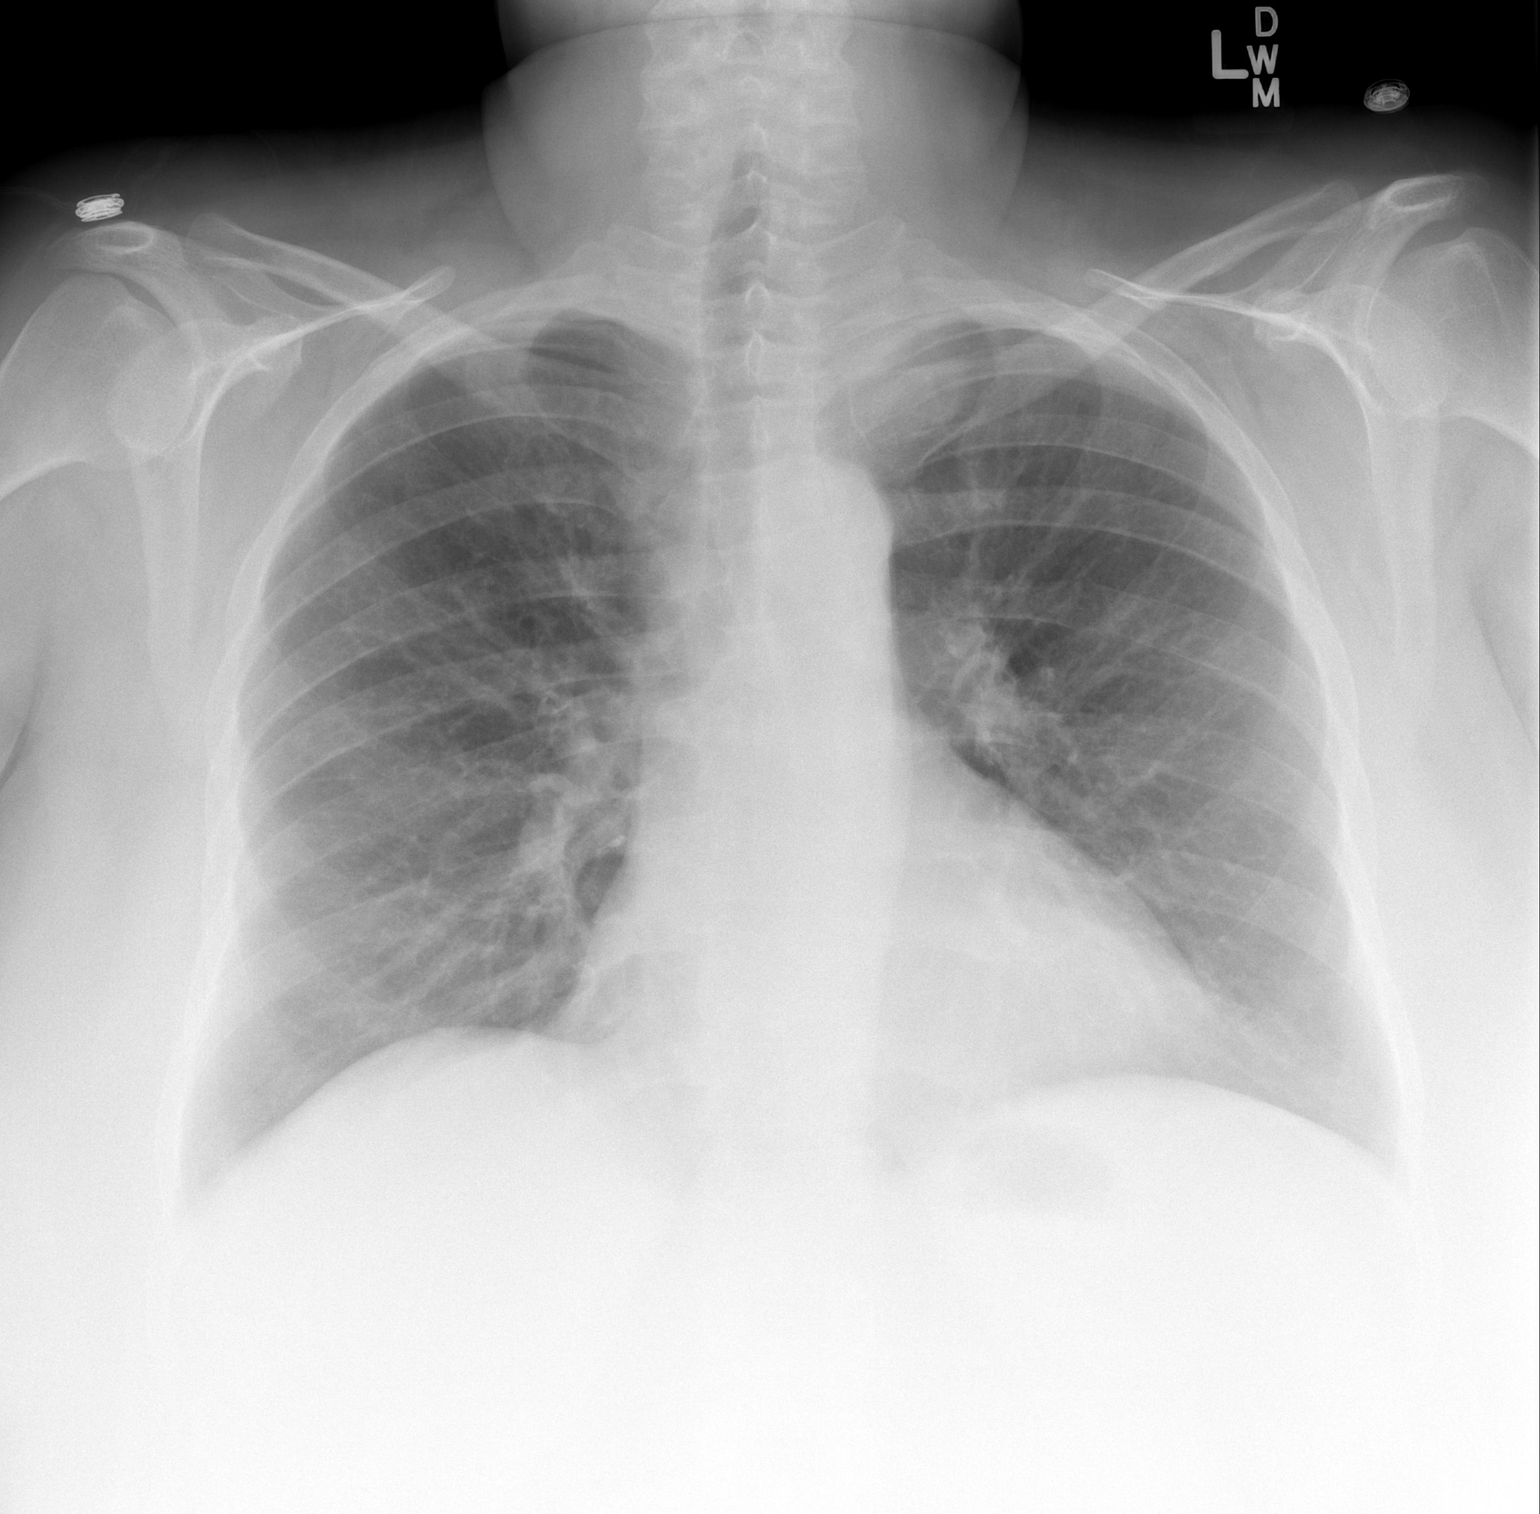

[w chest lat]
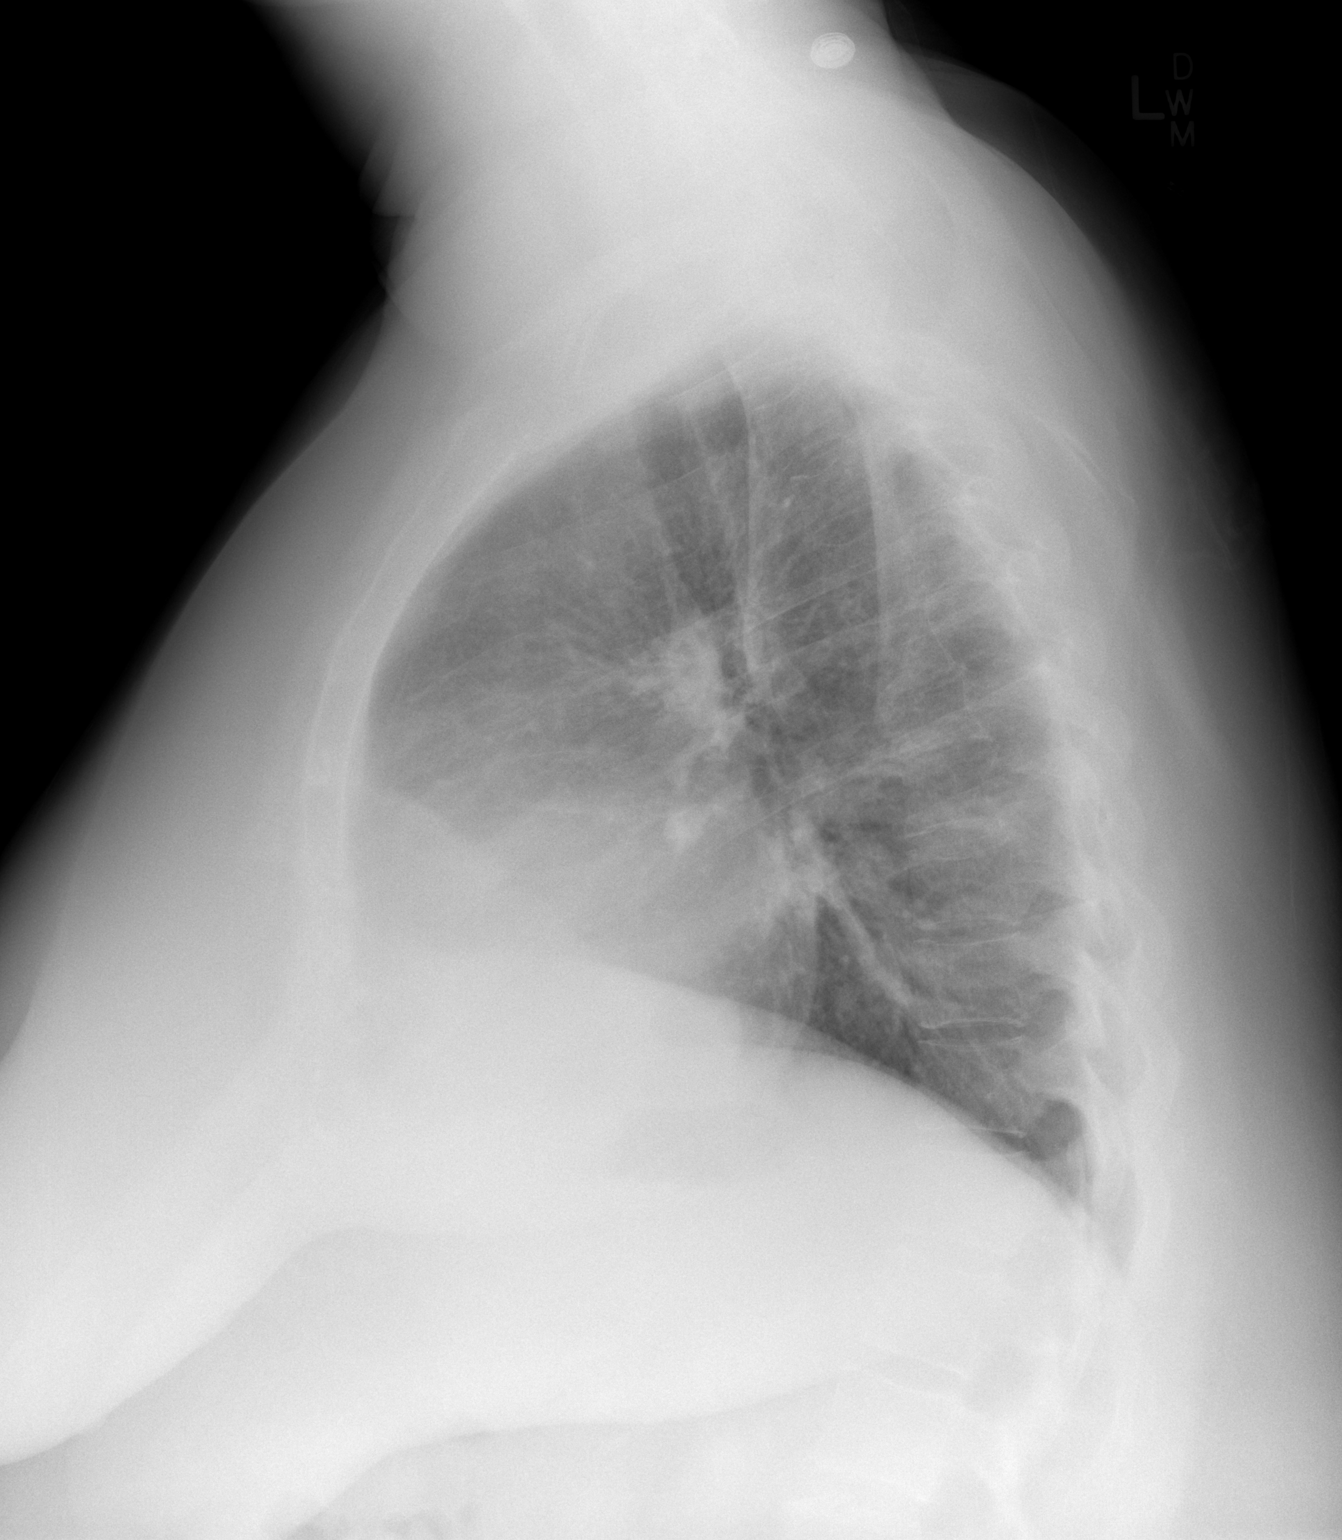

[2 of 2 positions shown; findings below may reference images not displayed]

FINDINGS: The heart is mildly enlarged.  The lungs are clear.  The
visualized soft tissues and bony thorax are unremarkable.
IMPRESSION: Negative chest.

## 2012-10-22 ENCOUNTER — Emergency Department (HOSPITAL_BASED_OUTPATIENT_CLINIC_OR_DEPARTMENT_OTHER)
Admission: EM | Admit: 2012-10-22 | Discharge: 2012-10-22 | Disposition: A | Discharge status: Home / Self Care | Payer: Self-pay | Attending: Emergency Medicine | Admitting: Emergency Medicine

## 2012-10-22 ENCOUNTER — Encounter (HOSPITAL_BASED_OUTPATIENT_CLINIC_OR_DEPARTMENT_OTHER): Payer: Self-pay

## 2012-10-22 DIAGNOSIS — N390 Urinary tract infection, site not specified: Secondary | ICD-10-CM | POA: Insufficient documentation

## 2012-10-22 DIAGNOSIS — Z8659 Personal history of other mental and behavioral disorders: Secondary | ICD-10-CM | POA: Insufficient documentation

## 2012-10-22 DIAGNOSIS — Z8589 Personal history of malignant neoplasm of other organs and systems: Secondary | ICD-10-CM | POA: Insufficient documentation

## 2012-10-22 DIAGNOSIS — N760 Acute vaginitis: Secondary | ICD-10-CM

## 2012-10-22 DIAGNOSIS — R300 Dysuria: Secondary | ICD-10-CM | POA: Insufficient documentation

## 2012-10-22 DIAGNOSIS — A5901 Trichomonal vulvovaginitis: Secondary | ICD-10-CM | POA: Insufficient documentation

## 2012-10-22 DIAGNOSIS — Z3202 Encounter for pregnancy test, result negative: Secondary | ICD-10-CM | POA: Insufficient documentation

## 2012-10-22 DIAGNOSIS — A599 Trichomoniasis, unspecified: Secondary | ICD-10-CM

## 2012-10-22 DIAGNOSIS — Z79899 Other long term (current) drug therapy: Secondary | ICD-10-CM | POA: Insufficient documentation

## 2012-10-22 LAB — WET PREP, GENITAL: Yeast Wet Prep HPF POC: NONE SEEN

## 2012-10-22 LAB — URINALYSIS, ROUTINE W REFLEX MICROSCOPIC
Bilirubin Urine: NEGATIVE
Glucose, UA: NEGATIVE mg/dL
Protein, ur: NEGATIVE mg/dL
Urobilinogen, UA: 1 mg/dL (ref 0.0–1.0)

## 2012-10-22 LAB — URINE MICROSCOPIC-ADD ON

## 2012-10-22 MED ORDER — CEPHALEXIN 500 MG PO CAPS: 500.0000 mg | ORAL_CAPSULE | Freq: Four times a day (QID) | ORAL | Status: DC

## 2012-10-22 MED ORDER — METRONIDAZOLE 500 MG PO TABS
2000.0000 mg | ORAL_TABLET | Freq: Once | ORAL | Status: AC
  Administered 2012-10-22: 2000 mg via ORAL
  Filled 2012-10-22: qty 4

## 2012-10-22 NOTE — ED Provider Notes (Signed)
History  This chart was scribed for Glynn Octave, MD by Bennett Scrape, ED Scribe. This patient was seen in room MH09/[facility]09 and the patient's care was started at 3:00 PM.  CSN: [id number]  Arrival date & time 10/22/12  1438   First MD Initiated Contact with Patient 10/22/12 1500      Chief Complaint  Patient presents with  . Abdominal Pain  . Dysuria     The history is provided by the patient. No language interpreter was used.    HPI Comments: Christina Glass is a 47 y.o. female who presents to the Emergency Department complaining of 3 days of gradual onset, non-changing, waxing and waning lower abdominal pain described as dull cramps with associated malordorous urine. She reports mild similarities to her menstrual cramps but states that she no longer has menses. She admits having a recent exposure to trichomonas after an unprotected sexual encounter with a partner who had another sexual partner test positive yesterday. She states that she is having normal BMs and has been eating and drinking normally since the onset.   She denies hematuria, dysuria, fevers, vaginal discharge, CP, SOB, fevers, nausea and emesis as associated symptoms.  She has a h/o c-sections and partial hysterectomy (one ovary left) but denies having a h/o DM. She denies being on any daily medications. Pt denies smoking and alcohol use.   Pt denies having a PCP currently  Past Medical History  Diagnosis Date  . Anxiety   . Neurofibromatosis     Past Surgical History  Procedure Laterality Date  . Leg surgery    . Abdominal hysterectomy      History reviewed. No pertinent family history.  History  Substance Use Topics  . Smoking status: Never Smoker   . Smokeless tobacco: Not on file  . Alcohol Use: No    Review of Systems  A complete 10 system review of systems was obtained and all systems are negative except as noted in the HPI and PMH.   Allergies  Ivp dye; Fish oil; and Shrimp  Home  Medications   Current Outpatient Rx  Name  Route  Sig  Dispense  Refill  . acyclovir (ZOVIRAX) 800 MG tablet      1 PO five times per day for 7 days   35 tablet   0   . cephALEXin (KEFLEX) 500 MG capsule   Oral   Take 1 capsule (500 mg total) by mouth 4 (four) times daily.   40 capsule   0     Triage Vitals: BP 140/81  Pulse 87  Temp(Src) 99.4 F (37.4 C) (Oral)  Resp 16  Ht 5\' 3"  (1.6 m)  Wt 220 lb (99.791 kg)  BMI 38.98 kg/m2  SpO2 96%  Physical Exam  Nursing note and vitals reviewed. Constitutional: She is oriented to person, place, and time. No distress.  Morbidly obese  HENT:  Head: Normocephalic and atraumatic.  Eyes: Conjunctivae and EOM are normal. Pupils are equal, round, and reactive to light.  Neck: Normal range of motion. Neck supple. No tracheal deviation present.  Cardiovascular: Normal rate and regular rhythm.   Intact distal pulses  Pulmonary/Chest: Effort normal and breath sounds normal. No respiratory distress.  Abdominal: Soft. There is tenderness (mild lower abdominal tenderness). There is no rebound and no guarding.  No CVA tenderness, no pain at McBurney's point   Genitourinary:  Cervix absent, white discharge in the vaginal vault, no adrenal tenderness, chaperone present  Musculoskeletal: Normal range of motion.  She exhibits no edema.  Neurological: She is alert and oriented to person, place, and time.  Skin: Skin is warm and dry.  Psychiatric: She has a normal mood and affect. Her behavior is normal.    ED Course  Procedures (including critical care time)  DIAGNOSTIC STUDIES: Oxygen Saturation is 96% on room air, normal by my interpretation.    COORDINATION OF CARE: 3:21 PM-Reviewed the possible causes of her symptoms with pt. Advised pt to use protection every time to avoid the possibility of an STD. Discussed treatment plan which includes pelvic exam, UA, and wet prep with pt at bedside and pt agreed to plan.   Labs Reviewed  WET  PREP, GENITAL - Abnormal; Notable for the following:    Trich, Wet Prep MODERATE (*)    Clue Cells Wet Prep HPF POC MODERATE (*)    WBC, Wet Prep HPF POC FEW (*)    All other components within normal limits  URINALYSIS, ROUTINE W REFLEX MICROSCOPIC - Abnormal; Notable for the following:    APPearance CLOUDY (*)    Hgb urine dipstick TRACE (*)    Nitrite POSITIVE (*)    Leukocytes, UA MODERATE (*)    All other components within normal limits  URINE MICROSCOPIC-ADD ON - Abnormal; Notable for the following:    Squamous Epithelial / LPF FEW (*)    Bacteria, UA MANY (*)    All other components within normal limits  GC/CHLAMYDIA PROBE AMP  URINE CULTURE  PREGNANCY, URINE   No results found.   1. Trichomonas   2. Urinary tract infection   3. Bacterial vaginosis       MDM  One day of lower abdominal pain after exposure to Trichomonas. Odor and pain with urination. Vital stable, no distress, no peritoneal signs  Abdomen is soft and nontender. Pelvic exam remarkable for white discharge.  Patient treated with 2 g of Flagyl for Trichomonas and bacterial vaginosis. UA appears infected. Start Keflex as well. Urine culture sent.  I personally performed the services described in this documentation, which was scribed in my presence. The recorded information has been reviewed and is accurate.          Glynn Octave, MD 10/22/12 [phone]

## 2012-10-22 NOTE — ED Notes (Signed)
Pt states that she has lower abdominal pain, dysuria, recently had unprotected sex, told that partner had had sex with someone that had trichomonas.   Marland Kitchen

## 2012-10-23 LAB — GC/CHLAMYDIA PROBE AMP: CT Probe RNA: NEGATIVE

## 2012-10-24 LAB — URINE CULTURE: Colony Count: >=100000

## 2012-10-25 ENCOUNTER — Telehealth (HOSPITAL_COMMUNITY): Payer: Self-pay | Admitting: Emergency Medicine

## 2012-10-25 NOTE — ED Notes (Signed)
Post ED Visit - Positive Culture Follow-up  Culture report reviewed by antimicrobial stewardship pharmacist: [x]  Wes Dulaney, Pharm.D., BCPS []  Celedonio Miyamoto, Pharm.D., BCPS []  Georgina Pillion, Pharm.D., BCPS []  [place], [address].D., BCPS, AAHIVP []  Estella Husk, Pharm.D., BCPS, AAHIVP  Positive urine culture Treated with Keflex, organism sensitive to the same and no further patient follow-up is required at this time.  Kylie A Holland 10/25/2012, 4:08 PM

## 2012-12-07 IMAGING — CR DG SHOULDER 2+V*R*
3 series · 3 of 3 positions shown · non-contrast
Comparison: None.

CLINICAL DATA: Right shoulder pain

RIGHT SHOULDER - 2+ VIEW

[w shoulder ap internal righ]
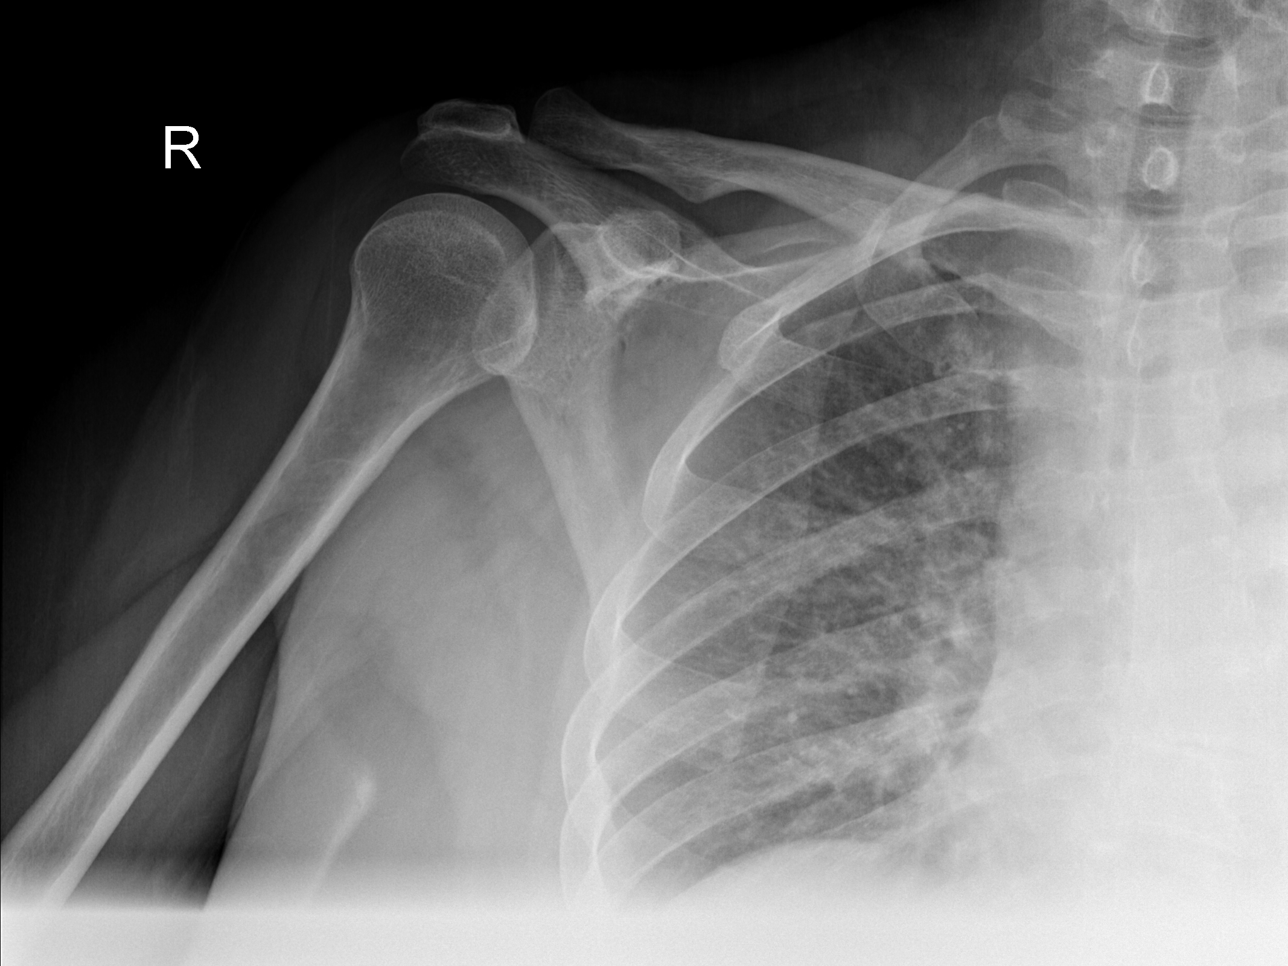

[w shoulder ap external righ]
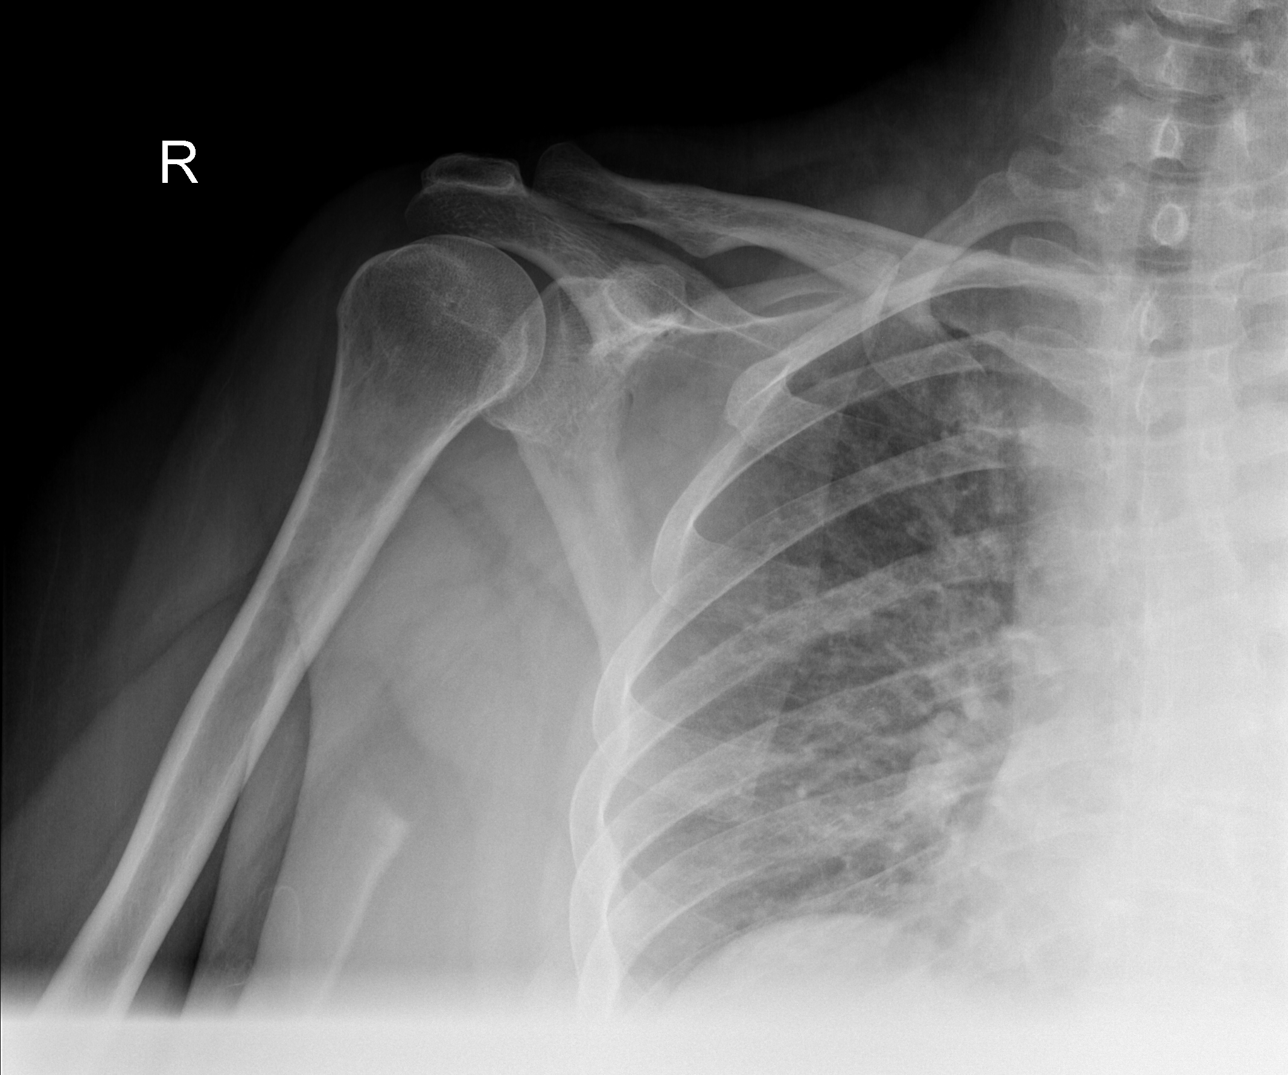

[x shoulder axillary right]
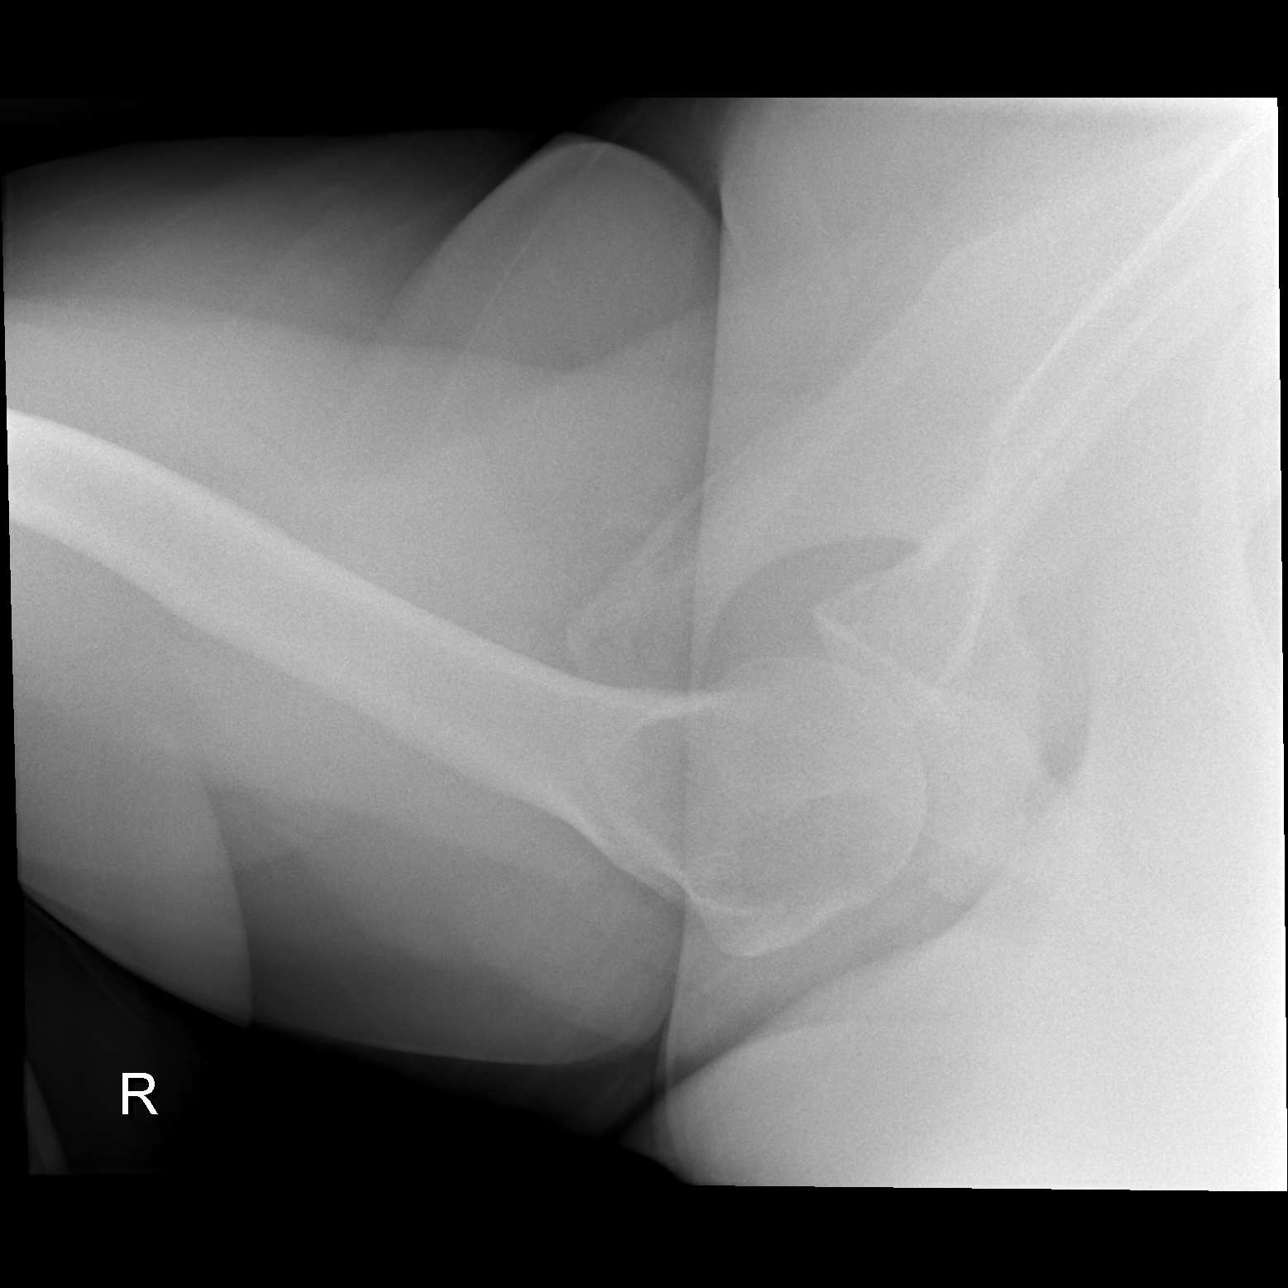

[3 of 3 positions shown; findings below may reference images not displayed]

FINDINGS: Normal alignment without fracture.  AC joint aligned.
Visualized right ribs intact.  Right lung clear.
IMPRESSION: No acute finding.

## 2013-02-27 ENCOUNTER — Emergency Department
Admission: EM | Admit: 2013-02-27 | Discharge: 2013-02-27 | Disposition: A | Discharge status: Home / Self Care | Payer: Worker's Comp, Other unspecified | Attending: Emergency Medicine | Admitting: Emergency Medicine

## 2013-02-27 ENCOUNTER — Emergency Department: Payer: Worker's Comp, Other unspecified

## 2013-02-27 ENCOUNTER — Emergency Department: Payer: Self-pay

## 2013-02-27 DIAGNOSIS — S6990XA Unspecified injury of unspecified wrist, hand and finger(s), initial encounter: Secondary | ICD-10-CM | POA: Insufficient documentation

## 2013-02-27 DIAGNOSIS — W230XXA Caught, crushed, jammed, or pinched between moving objects, initial encounter: Secondary | ICD-10-CM | POA: Insufficient documentation

## 2013-02-27 DIAGNOSIS — Y9269 Other specified industrial and construction area as the place of occurrence of the external cause: Secondary | ICD-10-CM | POA: Insufficient documentation

## 2013-02-27 MED ORDER — ACETAMINOPHEN 500 MG PO TABS
1000.0000 mg | ORAL_TABLET | Freq: Once | ORAL | Status: AC
Start: 2013-02-27 — End: 2013-02-27
  Administered 2013-02-27: 1000 mg via ORAL
  Filled 2013-02-27: qty 2

## 2013-02-27 NOTE — ED Provider Notes (Signed)
EMERGENCY DEPARTMENT HISTORY AND PHYSICAL EXAM     Physician/Midlevel provider first contact with patient: 02/27/13 0919         Date: 02/27/2013  Patient Name: Jennifer Hawkins    History of Presenting Illness     No chief complaint on file.      History Provided By: pt    Chief Complaint: finger injury  Onset: 0730 this morning  Timing: sudden  Location: R 5th digit  Quality: "throbbing"  Severity: mild  Modifying Factors: motrin, slight relief  Associated Symptoms: none    Additional History: Jennifer Hawkins is a 47 y.o. female presenting to ED for eval of right 5th digit injury that occurred at 0730 this morning. Pt sts she accidentally slammed finger between her desk and chair while at work. She describes a throbbing pain for which she has had slight relief with motrin. Pt sts no previous injury to this finger.     PCP: Bethanie Dicker, MD      Current Facility-Administered Medications   Medication Dose Route Frequency Provider Last Rate Last Dose   . [COMPLETED] acetaminophen (TYLENOL) tablet 1,000 mg  1,000 mg Oral Once Brooke Bonito, DO   1,000 mg at 02/27/13 0957     No current outpatient prescriptions on file.       Past History     Past Medical History:  History reviewed. No pertinent past medical history.    Past Surgical History:  Past Surgical History   Procedure Date   . Hysterectomy        Family History:  No family history on file.    Social History:  History   Substance Use Topics   . Smoking status: Never Smoker    . Smokeless tobacco: Not on file   . Alcohol Use: No       Allergies:  Allergies   Allergen Reactions   . Codeine Nausea Only       Review of Systems     Review of Systems   Constitutional: Negative for fever.   Musculoskeletal:        +right 5th digit pain   Skin: Negative for rash.   Neurological: Positive for headaches.   Endo/Heme/Allergies:        +codeine allergy   Psychiatric/Behavioral: Negative for suicidal ideas.         Physical Exam   BP 187/94  Pulse  81  Temp 98.2 F (36.8 C)  Resp 16  Ht 1.524 m  Wt 81.194 kg  BMI 34.96 kg/m2  SpO2 99%    Physical Exam   Constitutional: Patient is oriented to person, place, and time and well-developed, well-nourished, and in no distress.   Head: Normocephalic and atraumatic.   Eyes: EOM are normal. Pupils are equal, round, and reactive to light.   Musculoskeletal: Normal range of motion. No gross deformities.  Right 5th digit with tenderness at distal phalange of pinky, no significant soft tissue swelling.  Acrylic nail is loose but nail base without e/o subungual hematoma.    Neurological: Patient is alert and oriented to person, place, and time. GCS score is 15. No focal deficits.  Skin: Skin is warm and dry.       Diagnostic Study Results     Labs -     Results     ** No Results found for the last 24 hours. **          Radiologic Studies -   Radiology  Results (24 Hour)     Procedure Component Value Units Date/Time    Finger Right Minimum 2 Vw [[id number]] Collected:02/27/13 0934    Order Status:Completed  Updated:02/27/13 8841    Narrative:    HISTORY: Right fifth finger pain    COMPARISON: None available    TECHNIQUE: 4 views     FINDINGS: There is no fracture or dislocation. There are no bony,  articular or soft tissue abnormalities.      Impression:     Normal examination    Lynnae Prude, MD   02/27/2013 9:35 AM      .      Medical Decision Making   I am the first provider for this patient.    I reviewed the vital signs, available nursing notes, past medical history, past surgical history, family history and social history.    Vital Signs-Reviewed the patient's vital signs.     Patient Vitals for the past 12 hrs:   BP Temp Pulse Resp   02/27/13 0902 187/94 mmHg - - -   02/27/13 0858 205/101 mmHg 98.2 F (36.8 C) 81  16        Pulse Oximetry Analysis - Normal 99% on RA    ED Course:   9:41 AM  Counseled on diagnosis, f/u plans, and signs and symptoms when to return to ED.  Pt is stable and ready for discharge.       Provider Notes: 49 F presents after jamming her 5th pinky finger between her desk and chair while at work.  Largely unremarkable exam, x-rays negative.  Patient agrees to f/u with PCP and will return for new or worsening symptoms.      Diagnosis     Clinical Impression:   1. Finger injury, right, initial encounter        _______________________________    Attestations:  This note is prepared by Claiborne Billings, acting as Scribe for [employer]	[place], DO.    Brooke Bonito, DO: The scribe's documentation has been prepared under my direction and personally reviewed by me in its entirety.  I confirm that the note above accurately reflects all work, treatment, procedures, and medical decision making performed by me.    _______________________________          Brooke Bonito, DO  02/27/13 1020

## 2013-02-27 NOTE — Discharge Instructions (Signed)
Follow up with your primary care doctor in 3-4 days.    Return to the ER for any worsening of your current symptoms or other concerns.    Finger Contusion    You have been seen forfinger contusion (bruise).    A contusion is a bruise. A contusion happens when something strikes or hits the body. This breaks small blood vessels called capillaries. When the capillaries break, blood leaks out. This makes the skin look red, purple, blue, or black. The injured area may hurt for a few days. If you take a blood thinner (like Coumadin or warfarin) the bruising may be worse.    These injuries can cause pain and swelling, and the finger may get discolored. Your evaluation today shows that you probably don t have a broken or dislocated bone. You can expect your symptoms to get better over the next7 days.    Some things you can do to help your injury are: Resting, Icing, Compressing and Elevating the injured area. Remember this as "RICE."     REST: Limit the use of the injured body part.      ICE: By applying ice to the affected area, swelling and pain can be reduced. Place some ice cubes in a re-sealable (Ziploc) bag and add some water. Put a thin washcloth between the bag and the skin. Apply the ice bag to the area for at least 20 minutes. Do this at least 4 times per day. It is okay to do this more often than directed. You can also do it for longer than directed. NEVER APPLY ICE DIRECTLY TO THE SKIN.      COMPRESS: Compression means to apply pressure around the injured area such as with a splint, cast or an ace bandage. Compression decreases swelling and improves comfort. Compression should be tight enough to relieve swelling but not so tight as to decrease circulation. Increasing pain, numbness, tingling, or change in skin color, are all signs of decreased circulation.     ELEVATE: Elevate the injured part.    Your doctor may prescribe you pain medications for yourpain. You can also use over-the-counter  medicines like acetaminophen (Tylenol), ibuprofen (Advil or Motrin) or naproxen (Aleve, Naprosyn). It is important to follow the directions for taking these medications.    YOU SHOULD SEEK MEDICAL ATTENTION IMMEDIATELY, EITHER HERE OR AT THE NEAREST EMERGENCY DEPARTMENT, IF ANY OF THE FOLLOWING OCCUR:     Your symptoms haven t started to get better in 5-10 days.   The finger is noticeably misshapen after the swelling gets better, especially if you didn t get x-rays during your visit. Sometimes a small fracture can t be seen easily with the first x-ray.   You start to have severe pain in the affected finger, or thefinger becomes pale, numb, and very firm to the touch.   You lose feeling in your finger or can t move it.    If you can t follow up with your doctor, or if at any time you feel you need to be rechecked or seen again, come back here or go to the nearest emergency department.

## 2013-08-08 ENCOUNTER — Encounter (HOSPITAL_BASED_OUTPATIENT_CLINIC_OR_DEPARTMENT_OTHER): Payer: Self-pay | Admitting: Emergency Medicine

## 2013-08-08 ENCOUNTER — Emergency Department (HOSPITAL_BASED_OUTPATIENT_CLINIC_OR_DEPARTMENT_OTHER)
Admission: EM | Admit: 2013-08-08 | Discharge: 2013-08-08 | Disposition: A | Discharge status: Home / Self Care | Payer: Self-pay | Attending: Emergency Medicine | Admitting: Emergency Medicine

## 2013-08-08 DIAGNOSIS — Z792 Long term (current) use of antibiotics: Secondary | ICD-10-CM | POA: Insufficient documentation

## 2013-08-08 DIAGNOSIS — Z8659 Personal history of other mental and behavioral disorders: Secondary | ICD-10-CM | POA: Insufficient documentation

## 2013-08-08 DIAGNOSIS — R51 Headache: Secondary | ICD-10-CM | POA: Insufficient documentation

## 2013-08-08 DIAGNOSIS — Z8669 Personal history of other diseases of the nervous system and sense organs: Secondary | ICD-10-CM | POA: Insufficient documentation

## 2013-08-08 DIAGNOSIS — J209 Acute bronchitis, unspecified: Secondary | ICD-10-CM | POA: Insufficient documentation

## 2013-08-08 DIAGNOSIS — J40 Bronchitis, not specified as acute or chronic: Secondary | ICD-10-CM

## 2013-08-08 HISTORY — DX: Bronchitis, not specified as acute or chronic: J40

## 2013-08-08 MED ORDER — AZITHROMYCIN 250 MG PO TABS: 250.0000 mg | ORAL_TABLET | Freq: Every day | ORAL | Status: DC

## 2013-08-08 MED ORDER — ACETAMINOPHEN 325 MG PO TABS
650.0000 mg | ORAL_TABLET | Freq: Once | ORAL | Status: AC
  Administered 2013-08-08: 650 mg via ORAL
  Filled 2013-08-08: qty 2

## 2013-08-08 MED ORDER — ALBUTEROL SULFATE HFA 108 (90 BASE) MCG/ACT IN AERS
INHALATION_SPRAY | RESPIRATORY_TRACT | Status: AC
  Filled 2013-08-08: qty 6.7

## 2013-08-08 MED ORDER — ALBUTEROL SULFATE HFA 108 (90 BASE) MCG/ACT IN AERS
2.0000 | INHALATION_SPRAY | Freq: Four times a day (QID) | RESPIRATORY_TRACT | Status: DC
  Administered 2013-08-08: 2 via RESPIRATORY_TRACT

## 2013-08-08 MED ORDER — ALBUTEROL SULFATE HFA 108 (90 BASE) MCG/ACT IN AERS: 2.0000 | INHALATION_SPRAY | Freq: Four times a day (QID) | RESPIRATORY_TRACT | Status: DC | PRN

## 2013-08-08 NOTE — Discharge Instructions (Signed)
Bronchitis Bronchitis is swelling (inflammation) of the air tubes leading to your lungs (bronchi). This causes mucus and a cough. If the swelling gets bad, you may have trouble breathing. HOME CARE   Rest.  Drink enough fluids to keep your pee (urine) clear or pale yellow (unless you have a condition where you have to watch how much you drink).  Only take medicine as told by your doctor. If you were given antibiotic medicines, finish them even if you start to feel better.  Avoid smoke, irritating chemicals, and strong smells. These make the problem worse. Quit smoking if you smoke. This helps your lungs heal faster.  Use a cool mist humidifier. Change the water in the humidifier every day. You can also sit in the bathroom with hot shower running for 5 10 minutes. Keep the door closed.  See your health care provider as told.  Wash your hands often. GET HELP IF: Your problems do not get better after 1 week. GET HELP RIGHT AWAY IF:   Your fever gets worse.  You have chills.  Your chest hurts.  Your problems breathing get worse.  You have blood in your mucus.  You pass out (faint).  You feel lightheaded.  You have a bad headache.  You throw up (vomit) again and again. MAKE SURE YOU:  Understand these instructions.  Will watch your condition.  Will get help right away if you are not doing well or get worse. Document Released: 10/24/2007 Document Revised: 02/25/2013 Document Reviewed: 12/30/2012 [facility] Patient Information 2014 [place], [place].  Use albuterol inhaler 2 puffs every 6 hours for the next 7 days. Take the antibiotic as directed. Return for new or worse symptoms.

## 2013-08-08 NOTE — ED Notes (Signed)
Pt states history of bronchitis. Pt has not been seen this year for bronchitis but feels that her cough is the same as the last time she was diagnosed. Pt states normally a z-pak helps her with her symptoms. Pt trying OTC medications with no relief.

## 2013-08-08 NOTE — ED Notes (Signed)
MD at bedside. 

## 2013-08-08 NOTE — ED Provider Notes (Signed)
CSN: [id number]     Arrival date & time 08/08/13  0019 History   First MD Initiated Contact with Patient 08/08/13 0207     Chief Complaint  Patient presents with  . Cough     (Consider location/radiation/quality/duration/timing/severity/associated sxs/prior Treatment) Patient is a 48 y.o. female presenting with cough. The history is provided by the patient.  Cough Associated symptoms: headaches and shortness of breath   Associated symptoms: no chest pain, no fever, no myalgias, no rash, no sore throat and no wheezing    patient with a history of bronchitis. Patient states that she's had a cough for the past several days occasionally productive. No fevers. Does not feel like she has an upper respiratory infection. Associated with a mild headache. Patient used to have albuterol inhaler but doesn't have one anymore. Patient states usually she is helped by a Z-Pak. Patient denies nausea vomiting or diarrhea  Past Medical History  Diagnosis Date  . Anxiety   . Neurofibromatosis   . Bronchitis    Past Surgical History  Procedure Laterality Date  . Leg surgery    . Abdominal hysterectomy     History reviewed. No pertinent family history. History  Substance Use Topics  . Smoking status: Never Smoker   . Smokeless tobacco: Not on file  . Alcohol Use: No   OB History   Grav Para Term Preterm Abortions TAB SAB Ect Mult Living                 Review of Systems  Constitutional: Negative for fever.  HENT: Negative for congestion and sore throat.   Respiratory: Positive for cough and shortness of breath. Negative for wheezing.   Cardiovascular: Negative for chest pain.  Gastrointestinal: Negative for abdominal pain.  Genitourinary: Negative for dysuria.  Musculoskeletal: Negative for myalgias.  Skin: Negative for rash.  Neurological: Positive for headaches.  Hematological: Does not bruise/bleed easily.  Psychiatric/Behavioral: Negative for confusion.      Allergies  Ivp dye;  Fish oil; and Shrimp  Home Medications   Current Outpatient Rx  Name  Route  Sig  Dispense  Refill  . pseudoephedrine (SUDAFED) 60 MG tablet   Oral   Take 60 mg by mouth every 4 (four) hours as needed for congestion.         Marland Kitchen acyclovir (ZOVIRAX) 800 MG tablet      1 PO five times per day for 7 days   35 tablet   0   . azithromycin (ZITHROMAX) 250 MG tablet   Oral   Take 1 tablet (250 mg total) by mouth daily. Take first 2 tablets together, then 1 every day until finished.   6 tablet   0   . cephALEXin (KEFLEX) 500 MG capsule   Oral   Take 1 capsule (500 mg total) by mouth 4 (four) times daily.   40 capsule   0    BP 163/90  Pulse 83  Temp(Src) 98.9 F (37.2 C) (Oral)  Resp 16  Ht 5\' 2"  (1.575 m)  Wt 268 lb 4 oz (121.677 kg)  BMI 49.05 kg/m2  SpO2 99% Physical Exam  Nursing note and vitals reviewed. Constitutional: She is oriented to person, place, and time. She appears well-developed and well-nourished. No distress.  HENT:  Head: Normocephalic and atraumatic.  Mouth/Throat: Oropharynx is clear and moist.  Eyes: Conjunctivae and EOM are normal. Pupils are equal, round, and reactive to light.  Neck: Normal range of motion. Neck supple.  Cardiovascular: Normal rate,  regular rhythm and normal heart sounds.   No murmur heard. Pulmonary/Chest: Effort normal and breath sounds normal. No respiratory distress. She has no wheezes.  Abdominal: Soft. Bowel sounds are normal. There is no tenderness.  Musculoskeletal: Normal range of motion.  Neurological: She is alert and oriented to person, place, and time. No cranial nerve deficit. She exhibits normal muscle tone. Coordination normal.  Skin: Skin is warm. No rash noted.    ED Course  Procedures (including critical care time) Labs Review Labs Reviewed - No data to display Imaging Review No results found.   EKG Interpretation None      MDM   Final diagnoses:  Bronchitis    Patient's lungs are clear no  evidence of wheezing. However we'll treat as a bronchitis episode. Patient afebrile nontoxic room air sats are 99%. No wheezing heard. Patient retrieve albuterol inhaler and Z-Pak.    Shelda JakesScott W. Shahzain Kiester, MD 08/08/13 [phone]

## 2014-08-12 ENCOUNTER — Encounter (HOSPITAL_BASED_OUTPATIENT_CLINIC_OR_DEPARTMENT_OTHER): Payer: Self-pay | Admitting: *Deleted

## 2014-08-12 ENCOUNTER — Emergency Department (HOSPITAL_BASED_OUTPATIENT_CLINIC_OR_DEPARTMENT_OTHER)
Admission: EM | Admit: 2014-08-12 | Discharge: 2014-08-12 | Disposition: A | Discharge status: Home / Self Care | Payer: Self-pay | Attending: Emergency Medicine | Admitting: Emergency Medicine

## 2014-08-12 DIAGNOSIS — Z792 Long term (current) use of antibiotics: Secondary | ICD-10-CM | POA: Insufficient documentation

## 2014-08-12 DIAGNOSIS — Z8709 Personal history of other diseases of the respiratory system: Secondary | ICD-10-CM | POA: Insufficient documentation

## 2014-08-12 DIAGNOSIS — Z8669 Personal history of other diseases of the nervous system and sense organs: Secondary | ICD-10-CM | POA: Insufficient documentation

## 2014-08-12 DIAGNOSIS — Z79899 Other long term (current) drug therapy: Secondary | ICD-10-CM | POA: Insufficient documentation

## 2014-08-12 DIAGNOSIS — R21 Rash and other nonspecific skin eruption: Secondary | ICD-10-CM | POA: Insufficient documentation

## 2014-08-12 DIAGNOSIS — Z8659 Personal history of other mental and behavioral disorders: Secondary | ICD-10-CM | POA: Insufficient documentation

## 2014-08-12 MED ORDER — PREDNISONE 20 MG PO TABS: 60.0000 mg | ORAL_TABLET | Freq: Every day | ORAL | Status: DC

## 2014-08-12 MED ORDER — PREDNISONE 50 MG PO TABS
60.0000 mg | ORAL_TABLET | Freq: Once | ORAL | Status: AC
  Administered 2014-08-12: 60 mg via ORAL
  Filled 2014-08-12 (×2): qty 1

## 2014-08-12 MED ORDER — TRAMADOL HCL 50 MG PO TABS
50.0000 mg | ORAL_TABLET | Freq: Four times a day (QID) | ORAL | Status: DC | PRN
Start: 2014-08-12 — End: 2015-01-01

## 2014-08-12 NOTE — ED Provider Notes (Signed)
CSN: [id number]     Arrival date & time 08/12/14  2302 History  This chart was scribed for Dione Booze, MD by Roxy Cedar, ED Scribe. This patient was seen in room MH03/MH03 and the patient's care was started at 11:23 PM.   Chief Complaint  Patient presents with  . Rash   Patient is a 49 y.o. female presenting with rash. The history is provided by the patient. No language interpreter was used.  Rash Location:  Torso and shoulder/arm Shoulder/arm rash location:  L shoulder and L arm Torso rash location:  L chest and R chest Quality: burning, itchiness and redness   Severity:  Moderate Onset quality:  Sudden Duration:  1 day Timing:  Constant Progression:  Unchanged Chronicity:  New Relieved by:  Nothing Worsened by:  Nothing tried Ineffective treatments: benadryl.  HPI Comments: Christina Glass is a 49 y.o. female with a PMHx of anxiety, neurofibromatosis, and bronchitis, who presents to the Emergency Department complaining of sudden onset of moderate rash to chest and left upper arm and shoulder that began yesterday. She reports associated itching, redness and burning. She states that she took benadryl yesterday with no relief. She denies exposure to any known allergens.  Past Medical History  Diagnosis Date  . Anxiety   . Neurofibromatosis   . Bronchitis    Past Surgical History  Procedure Laterality Date  . Leg surgery    . Abdominal hysterectomy     No family history on file. History  Substance Use Topics  . Smoking status: Never Smoker   . Smokeless tobacco: Not on file  . Alcohol Use: No   OB History    No data available     Review of Systems  Skin: Positive for rash.  All other systems reviewed and are negative.  Allergies  Ivp dye; Fish oil; and Shrimp  Home Medications   Prior to Admission medications   Medication Sig Start Date End Date Taking? Authorizing Provider  acyclovir (ZOVIRAX) 800 MG tablet 1 PO five times per day for 7 days 01/30/12    Charlestine Night, PA-C  azithromycin (ZITHROMAX) 250 MG tablet Take 1 tablet (250 mg total) by mouth daily. Take first 2 tablets together, then 1 every day until finished. 08/08/13   Vanetta Mulders, MD  cephALEXin (KEFLEX) 500 MG capsule Take 1 capsule (500 mg total) by mouth 4 (four) times daily. 10/22/12   Glynn Octave, MD  pseudoephedrine (SUDAFED) 60 MG tablet Take 60 mg by mouth every 4 (four) hours as needed for congestion.    Historical Provider, MD   Triage Vitals: BP 149/85 mmHg  Pulse 76  Temp(Src) 98.4 F (36.9 C) (Oral)  Resp 18  Ht  (1.575 m)  Wt 240 lb (108.863 kg)  BMI 43.89 kg/m2  SpO2 98%  Physical Exam  Constitutional: She is oriented to person, place, and time. She appears well-developed and well-nourished.  HENT:  Head: Normocephalic and atraumatic.  Eyes: EOM are normal. Pupils are equal, round, and reactive to light.  Neck: Normal range of motion. Neck supple. No JVD present.  Cardiovascular: Normal rate, regular rhythm and normal heart sounds.   No murmur heard. Pulmonary/Chest: Effort normal and breath sounds normal. No respiratory distress. She has no wheezes. She has no rales. She exhibits no tenderness.  Abdominal: Soft. Bowel sounds are normal. She exhibits no distension and no mass. There is no tenderness.  Musculoskeletal: Normal range of motion. She exhibits no edema.  Lymphadenopathy:  She has no cervical adenopathy.  Neurological: She is alert and oriented to person, place, and time. No cranial nerve deficit. Coordination abnormal.  Skin: Skin is warm and dry. Rash noted. There is erythema.  Erythematous rash over the central chest and medial aspects of the breasts and left shoulder. Slightly raised with thickening of the skin. Nonspecific in appearance.  Psychiatric: She has a normal mood and affect. Her behavior is normal. Judgment and thought content normal.  Nursing note and vitals reviewed.  ED Course  Procedures (including critical  care time)  DIAGNOSTIC STUDIES: Oxygen Saturation is 98% on RA, normal by my interpretation.    COORDINATION OF CARE: 11:26 PM- Discussed plans to give patient prednisone. Advised patient to continue taking benadryl and zantac as needed. Pt advised of plan for treatment and pt agrees.   MDM   Final diagnoses:  Rash    Nonspecific rash. Area involved was not some exposed. She is on no new medication. There's been no known ureter and contacts. She is discharged with prescription for prednisone and also given a prescription for tramadol for pain. Advised to continue using diphenhydramine with possible addition of famotidine or ranitidine to help control itching and burning.  I personally performed the services described in this documentation, which was scribed in my presence. The recorded information has been reviewed and is accurate.     Dione Boozeavid Saul Dorsi, MD 08/13/14 [phone]

## 2014-08-12 NOTE — ED Notes (Signed)
Rash on her chest. Burning and hurting blisters.

## 2014-08-12 NOTE — Discharge Instructions (Signed)
The cause of your rash is not clear tonight. Please continue taking diphenhydramine (Benadryl) every 4 hours as needed for itching. You may take ranitidine (Zantac) or famotidine (Pepcid) to help the Benadryl be more effective against itching. Take acetaminophen or ibuprofen as needed for pain. Take tramadol as needed for more severe pain.  Rash A rash is a change in the color or texture of your skin. There are many different types of rashes. You may have other problems that accompany your rash. CAUSES   Infections.  Allergic reactions. This can include allergies to pets or foods.  Certain medicines.  Exposure to certain chemicals, soaps, or cosmetics.  Heat.  Exposure to poisonous plants.  Tumors, both cancerous and noncancerous. SYMPTOMS   Redness.  Scaly skin.  Itchy skin.  Dry or cracked skin.  Bumps.  Blisters.  Pain. DIAGNOSIS  Your caregiver may do a physical exam to determine what type of rash you have. A skin sample (biopsy) may be taken and examined under a microscope. TREATMENT  Treatment depends on the type of rash you have. Your caregiver may prescribe certain medicines. For serious conditions, you may need to see a skin doctor (dermatologist). HOME CARE INSTRUCTIONS   Avoid the substance that caused your rash.  Do not scratch your rash. This can cause infection.  You may take cool baths to help stop itching.  Only take over-the-counter or prescription medicines as directed by your caregiver.  Keep all follow-up appointments as directed by your caregiver. SEEK IMMEDIATE MEDICAL CARE IF:  You have increasing pain, swelling, or redness.  You have a fever.  You have new or severe symptoms.  You have body aches, diarrhea, or vomiting.  Your rash is not better after 3 days. MAKE SURE YOU:  Understand these instructions.  Will watch your condition.  Will get help right away if you are not doing well or get worse. Document Released: 04/27/2002  Document Revised: 07/30/2011 Document Reviewed: 02/19/2011 [facility] Patient Information 2015 [place], [place]. This information is not intended to replace advice given to you by your health care provider. Make sure you discuss any questions you have with your health care provider.  Prednisone tablets What is this medicine? PREDNISONE (PRED ni sone) is a corticosteroid. It is commonly used to treat inflammation of the skin, joints, lungs, and other organs. Common conditions treated include asthma, allergies, and arthritis. It is also used for other conditions, such as blood disorders and diseases of the adrenal glands. This medicine may be used for other purposes; ask your health care provider or pharmacist if you have questions. COMMON BRAND NAME(S): Deltasone, Predone, Sterapred, Sterapred DS What should I tell my health care provider before I take this medicine? They need to know if you have any of these conditions: -Cushing's syndrome -diabetes -glaucoma -heart disease -high blood pressure -infection (especially a virus infection such as chickenpox, cold sores, or herpes) -kidney disease -liver disease -mental illness -myasthenia gravis -osteoporosis -seizures -stomach or intestine problems -thyroid disease -an unusual or allergic reaction to lactose, prednisone, other medicines, foods, dyes, or preservatives -pregnant or trying to get pregnant -breast-feeding How should I use this medicine? Take this medicine by mouth with a glass of water. Follow the directions on the prescription label. Take this medicine with food. If you are taking this medicine once a day, take it in the morning. Do not take more medicine than you are told to take. Do not suddenly stop taking your medicine because you may develop a severe  reaction. Your doctor will tell you how much medicine to take. If your doctor wants you to stop the medicine, the dose may be slowly lowered over time to avoid any side  effects. Talk to your pediatrician regarding the use of this medicine in children. Special care may be needed. Overdosage: If you think you have taken too much of this medicine contact a poison control center or emergency room at once. NOTE: This medicine is only for you. Do not share this medicine with others. What if I miss a dose? If you miss a dose, take it as soon as you can. If it is almost time for your next dose, talk to your doctor or health care professional. You may need to miss a dose or take an extra dose. Do not take double or extra doses without advice. What may interact with this medicine? Do not take this medicine with any of the following medications: -metyrapone -mifepristone This medicine may also interact with the following medications: -aminoglutethimide -amphotericin B -aspirin and aspirin-like medicines -barbiturates -certain medicines for diabetes, like glipizide or glyburide -cholestyramine -cholinesterase inhibitors -cyclosporine -digoxin -diuretics -ephedrine -female hormones, like estrogens and birth control pills -isoniazid -ketoconazole -NSAIDS, medicines for pain and inflammation, like ibuprofen or naproxen -phenytoin -rifampin -toxoids -vaccines -warfarin This list may not describe all possible interactions. Give your health care provider a list of all the medicines, herbs, non-prescription drugs, or dietary supplements you use. Also tell them if you smoke, drink alcohol, or use illegal drugs. Some items may interact with your medicine. What should I watch for while using this medicine? Visit your doctor or health care professional for regular checks on your progress. If you are taking this medicine over a prolonged period, carry an identification card with your name and address, the type and dose of your medicine, and your doctor's name and address. This medicine may increase your risk of getting an infection. Tell your doctor or health care  professional if you are around anyone with measles or chickenpox, or if you develop sores or blisters that do not heal properly. If you are going to have surgery, tell your doctor or health care professional that you have taken this medicine within the last twelve months. Ask your doctor or health care professional about your diet. You may need to lower the amount of salt you eat. This medicine may affect blood sugar levels. If you have diabetes, check with your doctor or health care professional before you change your diet or the dose of your diabetic medicine. What side effects may I notice from receiving this medicine? Side effects that you should report to your doctor or health care professional as soon as possible: -allergic reactions like skin rash, itching or hives, swelling of the face, lips, or tongue -changes in emotions or moods -changes in vision -depressed mood -eye pain -fever or chills, cough, sore throat, pain or difficulty passing urine -increased thirst -swelling of ankles, feet Side effects that usually do not require medical attention (report to your doctor or health care professional if they continue or are bothersome): -confusion, excitement, restlessness -headache -nausea, vomiting -skin problems, acne, thin and shiny skin -trouble sleeping -weight gain This list may not describe all possible side effects. Call your doctor for medical advice about side effects. You may report side effects to FDA at 1-800-FDA-1088. Where should I keep my medicine? Keep out of the reach of children. Store at room temperature between 15 and 30 degrees C (59 and 86 degrees  F). Protect from light. Keep container tightly closed. Throw away any unused medicine after the expiration date. NOTE: This sheet is a summary. It may not cover all possible information. If you have questions about this medicine, talk to your doctor, pharmacist, or health care provider.  2015, Elsevier/Gold Standard.  (2010-12-21 10:57:14)  Trametinib oral tablets What is this medicine? TRAMETINIB (tra me ti nib) is used to treat certain types of melanoma, a skin cancer. It targets specific cancer cells and stops the cancer cells from growing. This medicine may be used for other purposes; ask your health care provider or pharmacist if you have questions. COMMON BRAND NAME(S): Mekinist What should I tell my health care provider before I take this medicine? They need to know if you have any of these conditions: -eye disease, vision problems -heart disease -high blood pressure -kidney disease -liver disease -an unusual or allergic reaction to trametinib, other medicines, foods, dyes, or preservatives -pregnant or trying to get pregnant -breast-feeding How should I use this medicine? Take this medicine by mouth with a glass of water. Follow the directions on the prescription label. Take this medicine on an empty stomach, at least 1 hour before or 2 hours after food. Do not take with food. Take your medicine at regular intervals. Do not take it more often than directed. Do not stop taking except on your doctor's advice. Talk to your pediatrician regarding the use of this medicine in children. Special care may be needed. Overdosage: If you think you've taken too much of this medicine contact a poison control center or emergency room at once. Overdosage: If you think you have taken too much of this medicine contact a poison control center or emergency room at once. NOTE: This medicine is only for you. Do not share this medicine with others. What if I miss a dose? If you miss a dose, take it as soon as you can. If your next dose is to be taken in less than 12 hours, then do not take the missed dose. Take the next dose at your regular time. Do not take double or extra doses. What may interact with this medicine? Drug interactions have not been identified. Give your health care provider a list of all the  medicines, herbs, non-prescription drugs, or dietary supplements you use. Also tell them if you smoke, drink alcohol, or use illegal drugs. Some items may interact with your medicine. This list may not describe all possible interactions. Give your health care provider a list of all the medicines, herbs, non-prescription drugs, or dietary supplements you use. Also tell them if you smoke, drink alcohol, or use illegal drugs. Some items may interact with your medicine. What should I watch for while using this medicine? Visit your doctor or health care professional for regular checks on your progress. Tell your doctor or health care professional right away if you have any change in your eyesight. Do not become pregnant while taking this medicine or for 4 months after stopping it. Women should inform their doctor if they wish to become pregnant or think they might be pregnant. There is a potential for serious side effects to an unborn child. Talk to your health care professional or pharmacist for more information. Do not breast-feed an infant while taking this medicine. In women, this medicine may interfere with the ability to have a child. You should talk with your doctor or health care professional if you are concerned about your fertility. What side effects may I notice  from receiving this medicine? Side effects that you should report to your doctor or health care professional as soon as possible: -allergic reactions like skin rash, itching or hives, swelling of the face, lips, or tongue -breathing problems -changes in vision -cough -dizziness -fast, irregular heartbeat -feeling faint or lightheaded -redness, swelling, or sores on hands or feet -swelling of the ankles Side effects that usually do not require medical attention (Report these to your doctor or health care professional if they continue or are bothersome.): -diarrhea -sore throat -stomach pain This list may not describe all possible  side effects. Call your doctor for medical advice about side effects. You may report side effects to FDA at 1-800-FDA-1088. Where should I keep my medicine? Store between 2 and 8 degrees C (36 and 46 degrees F). Do not freeze. Keep this medicine in the original container. Throw away any unused medicine after the expiration date. Keep out of the reach of children. NOTE: This sheet is a summary. It may not cover all possible information. If you have questions about this medicine, talk to your doctor, pharmacist, or health care provider.  2015, Elsevier/Gold Standard. (2011-10-30 14:35:01)

## 2015-01-01 ENCOUNTER — Emergency Department (HOSPITAL_BASED_OUTPATIENT_CLINIC_OR_DEPARTMENT_OTHER)
Admission: EM | Admit: 2015-01-01 | Discharge: 2015-01-01 | Disposition: A | Discharge status: Home / Self Care | Payer: Self-pay | Attending: Physician Assistant | Admitting: Physician Assistant

## 2015-01-01 ENCOUNTER — Encounter (HOSPITAL_BASED_OUTPATIENT_CLINIC_OR_DEPARTMENT_OTHER): Payer: Self-pay

## 2015-01-01 DIAGNOSIS — Z8659 Personal history of other mental and behavioral disorders: Secondary | ICD-10-CM | POA: Insufficient documentation

## 2015-01-01 DIAGNOSIS — Q8500 Neurofibromatosis, unspecified: Secondary | ICD-10-CM | POA: Insufficient documentation

## 2015-01-01 DIAGNOSIS — A084 Viral intestinal infection, unspecified: Secondary | ICD-10-CM | POA: Insufficient documentation

## 2015-01-01 DIAGNOSIS — Z8709 Personal history of other diseases of the respiratory system: Secondary | ICD-10-CM | POA: Insufficient documentation

## 2015-01-01 LAB — URINALYSIS, ROUTINE W REFLEX MICROSCOPIC
Bilirubin Urine: NEGATIVE
GLUCOSE, UA: NEGATIVE mg/dL
Ketones, ur: NEGATIVE mg/dL
Nitrite: NEGATIVE
Protein, ur: NEGATIVE mg/dL
Specific Gravity, Urine: 1.019 (ref 1.005–1.030)
UROBILINOGEN UA: 1 mg/dL (ref 0.0–1.0)
pH: 6.5 (ref 5.0–8.0)

## 2015-01-01 LAB — URINE MICROSCOPIC-ADD ON

## 2015-01-01 MED ORDER — ONDANSETRON HCL 4 MG PO TABS: 4.0000 mg | ORAL_TABLET | Freq: Three times a day (TID) | ORAL | Status: AC | PRN

## 2015-01-01 MED ORDER — ONDANSETRON HCL 8 MG PO TABS: 4.0000 mg | ORAL_TABLET | Freq: Once | ORAL | Status: AC

## 2015-01-01 MED ORDER — ONDANSETRON 4 MG PO TBDP
ORAL_TABLET | ORAL | Status: AC
  Administered 2015-01-01: 4 mg
  Filled 2015-01-01: qty 1

## 2015-01-01 NOTE — ED Notes (Signed)
Patient sipping ginger ale  

## 2015-01-01 NOTE — ED Notes (Signed)
Patient here with nausea and vomiting that started yesterday and general headache. Denies abdominal pain, no diarrhea.

## 2015-01-01 NOTE — ED Provider Notes (Signed)
CSN: [id number]     Arrival date & time 01/01/15  4010 History   First MD Initiated Contact with Patient 01/01/15 [id number]     Chief Complaint  Patient presents with  . Emesis     (Consider location/radiation/quality/duration/timing/severity/associated sxs/prior Treatment) HPI  Patient is a well-appearing appearing 49 year old female presenting today with vomiting 3. 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 Patient states she has a poor taste in her mouth but otherwise feels well. Patient had a little bit of loose stools yesterday.  Patient appears well hydrated on exam.   Past Medical History  Diagnosis Date  . Anxiety   . Neurofibromatosis   . Bronchitis    Past Surgical History  Procedure Laterality Date  . Leg surgery    . Abdominal hysterectomy     No family history on file. Social History  Substance Use Topics  . Smoking status: Never Smoker   . Smokeless tobacco: None  . Alcohol Use: No   OB History    No data available     Review of Systems  Constitutional: Negative for activity change.  Respiratory: Negative for shortness of breath.   Cardiovascular: Negative for chest pain.  Gastrointestinal: Positive for nausea and vomiting. Negative for abdominal pain.      Allergies  Ivp dye; Fish oil; and Shrimp  Home Medications   Prior to Admission medications   Not on File   BP 134/57 mmHg  Pulse 76  Temp(Src) 98.1 F (36.7 C) (Oral)  Resp 18  Ht  (1.575 m)  Wt 240 lb (108.863 kg)  BMI 43.89 kg/m2  SpO2 100% Physical Exam  Constitutional: She is oriented to person, place, and time. She appears well-developed and well-nourished.  HENT:  Head: Normocephalic and atraumatic.  Eyes: Conjunctivae are normal. Right eye exhibits no discharge.  Neck: Neck supple.  Cardiovascular: Normal rate, regular rhythm and normal heart sounds.   No murmur heard. Pulmonary/Chest: Effort normal and breath  sounds normal. She has no wheezes. She has no rales.  Abdominal: Soft. She exhibits no distension. There is no tenderness.  Musculoskeletal: Normal range of motion. She exhibits no edema.  Neurological: She is oriented to person, place, and time. No cranial nerve deficit.  Skin: Skin is warm and dry. No rash noted. She is not diaphoretic.  Psychiatric: She has a normal mood and affect. Her behavior is normal.  Nursing note and vitals reviewed.   ED Course  Procedures (including critical care time) Labs Review Labs Reviewed  URINE CULTURE  URINALYSIS, ROUTINE W REFLEX MICROSCOPIC (NOT AT [facility])    Imaging Review No results found. I, Vedant Shehadeh L Lyna Laningham, personally reviewed and evaluated these images and lab results as part of my medical decision-making.   EKG Interpretation None      MDM   Final diagnoses:  None    Patient is a 31 her female who had loose stools yesterday and vomited 3 times overnight. Patient appears well exam. Moist mucous membranes with no tenderness to palpation of abdomen. I think this likely represents viral gastroenteritis. We will give her Zofran and PO challenge her. We will give her two days off work as as she asked for.   Briauna Gilmartin Randall An, MD 01/01/15 [phone]

## 2015-01-01 NOTE — Discharge Instructions (Signed)
We think viral gastroenteritis. Please be sure to take Zofran and drink plenty of fluids. Please return if you have any abdominal pain.

## 2015-01-04 LAB — URINE CULTURE: Culture: >=100000

## 2015-01-05 ENCOUNTER — Telehealth (HOSPITAL_COMMUNITY): Payer: Self-pay

## 2015-01-05 NOTE — Telephone Encounter (Signed)
Post ED Visit - Positive Culture Follow-up  Culture report reviewed by antimicrobial stewardship pharmacist:  Wes Dulaney, Pharm.D., BCPS  Celedonio Miyamoto, Pharm.D., BCPS  Georgina Pillion, [address].D., BCPS  [place], [address].D., BCPS, AAHIVP  Estella Husk, Pharm.D., BCPS, AAHIVP  Elder Cyphers, [address].D., BCPS X  [employer]. [employer]. D.   Positive Urine culture, >/+ 100,000 colonies -> E Coli Chart reviewed by B. Laveda Norman PA "No treatment needed"  Christina Glass 01/05/2015, 5:06 PM

## 2015-01-05 NOTE — Progress Notes (Signed)
ED Antimicrobial Stewardship Positive Culture Follow Up   Christina Glass is an 49 y.o. female who presented to [facility] on 01/01/2015 with a chief complaint of  Chief Complaint  Patient presents with  . Emesis    Recent Results (from the past 720 hour(s))  Urine culture     Status: None   Collection Time: 01/01/15  8:45 AM  Result Value Ref Range Status   Specimen Description URINE, CLEAN CATCH  Final   Special Requests NONE  Final   Culture   Final    >=100,000 COLONIES/mL ESCHERICHIA COLI Performed at [facility]    Report Status 01/04/2015 FINAL  Final   Organism ID, Bacteria ESCHERICHIA COLI  Final      Susceptibility   Escherichia coli - MIC*    AMPICILLIN <=2 SENSITIVE Sensitive     CEFAZOLIN <=4 SENSITIVE Sensitive     CEFTRIAXONE <=1 SENSITIVE Sensitive     CIPROFLOXACIN <=0.25 SENSITIVE Sensitive     GENTAMICIN <=1 SENSITIVE Sensitive     IMIPENEM <=0.25 SENSITIVE Sensitive     NITROFURANTOIN <=16 SENSITIVE Sensitive     TRIMETH/SULFA <=20 SENSITIVE Sensitive     AMPICILLIN/SULBACTAM <=2 SENSITIVE Sensitive     PIP/TAZO <=4 SENSITIVE Sensitive     * >=100,000 COLONIES/mL ESCHERICHIA COLI    Patient discharged originally without antimicrobial agent and treatment is not indicated at this time. No urinary symptoms.  ED Provider: Fayrene Helper, PA-C  Ancil Boozer 01/05/2015, 11:24 AM Infectious Diseases Pharmacist Phone# [phone]

## 2015-04-30 ENCOUNTER — Encounter (HOSPITAL_BASED_OUTPATIENT_CLINIC_OR_DEPARTMENT_OTHER): Payer: Self-pay | Admitting: Emergency Medicine

## 2015-04-30 ENCOUNTER — Emergency Department (HOSPITAL_BASED_OUTPATIENT_CLINIC_OR_DEPARTMENT_OTHER)
Admission: EM | Admit: 2015-04-30 | Discharge: 2015-04-30 | Disposition: A | Discharge status: Home / Self Care | Payer: Self-pay | Attending: Emergency Medicine | Admitting: Emergency Medicine

## 2015-04-30 DIAGNOSIS — Z8659 Personal history of other mental and behavioral disorders: Secondary | ICD-10-CM | POA: Insufficient documentation

## 2015-04-30 DIAGNOSIS — H6691 Otitis media, unspecified, right ear: Secondary | ICD-10-CM | POA: Insufficient documentation

## 2015-04-30 DIAGNOSIS — J069 Acute upper respiratory infection, unspecified: Secondary | ICD-10-CM | POA: Insufficient documentation

## 2015-04-30 DIAGNOSIS — B9789 Other viral agents as the cause of diseases classified elsewhere: Secondary | ICD-10-CM

## 2015-04-30 MED ORDER — AMOXICILLIN 500 MG PO CAPS
500.0000 mg | ORAL_CAPSULE | Freq: Once | ORAL | Status: AC
  Administered 2015-04-30: 500 mg via ORAL
  Filled 2015-04-30: qty 1

## 2015-04-30 MED ORDER — BENZONATATE 100 MG PO CAPS: 200.0000 mg | ORAL_CAPSULE | Freq: Two times a day (BID) | ORAL | Status: AC | PRN

## 2015-04-30 MED ORDER — FLUTICASONE PROPIONATE 50 MCG/ACT NA SUSP: 2.0000 | Freq: Every day | NASAL | Status: AC

## 2015-04-30 MED ORDER — AMOXICILLIN 500 MG PO CAPS: 500.0000 mg | ORAL_CAPSULE | Freq: Three times a day (TID) | ORAL | Status: DC

## 2015-04-30 MED ORDER — CARBAMIDE PEROXIDE 6.5 % OT SOLN
5.0000 [drp] | Freq: Once | OTIC | Status: DC
  Filled 2015-04-30: qty 15

## 2015-04-30 MED ORDER — DOCUSATE SODIUM 50 MG/5ML PO LIQD
50.0000 mg | Freq: Once | ORAL | Status: DC
  Filled 2015-04-30: qty 10

## 2015-04-30 MED ORDER — IBUPROFEN 800 MG PO TABS
800.0000 mg | ORAL_TABLET | Freq: Once | ORAL | Status: AC
  Administered 2015-04-30: 800 mg via ORAL
  Filled 2015-04-30: qty 1

## 2015-04-30 NOTE — ED Notes (Signed)
Pt c/o right ear pain and nasal congestion with cough for past week.

## 2015-04-30 NOTE — Discharge Instructions (Signed)
1. Medications: flonase, mucinex, tessalon, amoxicillin, usual home medications 2. Treatment: rest, drink plenty of fluids, take tylenol or ibuprofen for fever control 3. Follow Up: Please followup with your primary doctor in 3 days for discussion of your diagnoses and further evaluation after today's visit; if you do not have a primary care doctor use the resource guide provided to find one; Return to the ER for high fevers, difficulty breathing or other concerning symptoms   Otitis Media, Adult Otitis media is redness, soreness, and puffiness (swelling) in the space just behind your eardrum (middle ear). It may be caused by allergies or infection. It often happens along with a cold. HOME CARE  Take your medicine as told. Finish it even if you start to feel better.  Only take over-the-counter or prescription medicines for pain, discomfort, or fever as told by your doctor.  Follow up with your doctor as told. GET HELP IF:  You have otitis media only in one ear, or bleeding from your nose, or both.  You notice a lump on your neck.  You are not getting better in 3-5 days.  You feel worse instead of better. GET HELP RIGHT AWAY IF:   You have pain that is not helped with medicine.  You have puffiness, redness, or pain around your ear.  You get a stiff neck.  You cannot move part of your face (paralysis).  You notice that the bone behind your ear hurts when you touch it. MAKE SURE YOU:   Understand these instructions.  Will watch your condition.  Will get help right away if you are not doing well or get worse.   This information is not intended to replace advice given to you by your health care provider. Make sure you discuss any questions you have with your health care provider.   Document Released: 10/24/2007 Document Revised: 05/28/2014 Document Reviewed: 12/02/2012 Elsevier Interactive Patient Education 2016 [employer]    Emergency Department Resource Guide 1)  Find a Doctor and Pay Out of Pocket Although you won't have to find out who is covered by your insurance plan, it is a good idea to ask around and get recommendations. You will then need to call the office and see if the doctor you have chosen will accept you as a new patient and what types of options they offer for patients who are self-pay. Some doctors offer discounts or will set up payment plans for their patients who do not have insurance, but you will need to ask so you aren't surprised when you get to your appointment.  2) Contact Your Local Health Department Not all health departments have doctors that can see patients for sick visits, but many do, so it is worth a call to see if yours does. If you don't know where your local health department is, you can check in your phone book. The CDC also has a tool to help you locate your state's health department, and many state websites also have listings of all of their local health departments.  3) Find a Walk-in Clinic If your illness is not likely to be very severe or complicated, you may want to try a walk in clinic. These are popping up all over the country in pharmacies, drugstores, and shopping centers. They're usually staffed by nurse practitioners or physician assistants that have been trained to treat common illnesses and complaints. They're usually fairly quick and inexpensive. However, if you have serious medical issues or chronic medical problems, these are probably  not your best option.  No Primary Care Doctor: - Call Health Connect at  [phone] - they can help you locate a primary care doctor that  accepts your insurance, provides certain services, etc. - Physician Referral Service- [phone]  Chronic Pain Problems: Organization         Address  Phone   Notes  [facility]  [phone] Patients need to be referred by their primary care doctor.   Medication Assistance: Organization         Address  Phone    Notes  [facility] Medication [facility] [address] [place] [place], [place] [address] [phone] --Must be a resident of [facility] -- Must have NO insurance coverage whatsoever (no Medicaid/ Medicare, etc.) -- The pt. MUST have a primary care doctor that directs their care regularly and follows them in the community   [facility]  [phone]   [employer]  [phone]    Agencies that provide inexpensive medical care: Organization         Address  Phone   Notes  [facility]  [phone]   [facility]    [phone]   [facility] [address] [place], [place] [address] [phone]   [facility] [address] [place] [place] [phone]   [facility]anned Parenthood    [phone]   [facility]    [phone]   [facility]  [address], [place] Phone:  [phone], Fax:  [phone] Hours of Operation:  9 am - 6 pm, M-F.  Also accepts Medicaid/Medicare and self-pay.  [facility]  [address], [place] Phone: [phone], Fax: [phone]. Hours of Operation:  8:30 am - 5:30 pm, M-F.  Also accepts Medicaid and self-pay.  [facility] [place] [address] [place] Phone: [phone]   [facility] [address] Natasha Bence [place], [place] [phone], Ext. [phone] Mondays & Thursdays: 7-9 AM.  First 15 patients are seen on a first come, first serve basis.    Medicaid-accepting [facility] Providers:  Organization         Address  Phone   Notes  [facility] [address], [place] [phone] Also accepts self-pay patients.  [facility] [address] Laurell Josephs [place], [place]  [phone]   [facility] [address], [place] [phone]   [facility] [address] [place] [phone]   Renaye Rakers [address], [place]   [phone] Only accepts [place] Access [place] patients after they have their name applied to their card.   Self-Pay (no insurance) in [facility]:  Organization         Address  Phone   Notes  Sickle Cell Patients, [facility] [address] [place], [place] [phone]   [facility] [address] [place], [place] [phone]   [facility]  [address], [place] [phone]   [facility]/Dr. Osei-Bonsu  [address] [place] or [address], [place] [phone] Phone number for both [place] and [place] locations is the same.  [facility] [address], [place] [phone]   [facility] [address] [place] or [address] [phone]   [facility] [address], [place] [phone], phone; [phone], fax Sees patients 1st and 3rd Saturday of every month.  Must not qualify for public or private insurance (i.e. Medicaid, Medicare, [place] Health Choice, Veterans' Benefits)  Household income should be no more than 200% of the poverty level The clinic cannot treat you if you are pregnant or think you are pregnant  Sexually transmitted diseases are not treated at the clinic.    Dental Care: Organization         Address  Phone  Notes  [facility] [address] [place], [place] [phone] Accepts children up to age 37 who are enrolled in [place] or [place] Health Choice; pregnant women with a Medicaid card; and children who have applied for Medicaid or [place] Health Choice, but were declined, whose parents can pay a reduced fee at time of service.  [facility]  [address], [place] [phone] Accepts children up to age 80 who are enrolled in [place] or [place] Health Choice; pregnant women with a Medicaid card; and children who have applied for Medicaid or [place] Health Choice, but were declined, whose parents can pay a reduced fee at time of service.  [facility]  [address] [place], [place] [phone] Patients are seen by appointment only. Walk-ins are not accepted. [facility] will see patients 50 years of age and older. Monday - Tuesday (8am-5pm) Most Wednesdays (8:30-5pm) $30 per visit, cash only  [facility]  [address], [facility] [phone] Patients are seen by appointment only. Walk-ins are not accepted. [facility] will see patients 7 years of age and older. One Wednesday Evening (Monthly: Volunteer Based).  $30 per visit, cash only  [employer]  [phone] for adults; Children under age 56, call Graduate Pediatric Dentistry at [phone]. Children aged 57-14, please call [phone] to request a pediatric application.  Dental services are provided in all areas of dental care including fillings, crowns and bridges, complete and partial dentures, implants, gum treatment, root canals, and extractions. Preventive care is also provided. Treatment is provided to both adults and children. Patients are selected via a lottery and there is often a waiting list.   [facility] [address] [place]  [phone] [email or web address]   [facility] [address] [place], [place] [phone], Ext. [phone] Second and Fourth Thursday of each month, opens at 6:30 AM; Clinic ends at 9 AM.  Patients are seen on a first-come first-served basis, and a limited number are seen during each clinic.   [facility]  [address] Ether Griffins [place], [place] [phone]   Eligibility Requirements You must have lived in  [place], [place], or [place] counties for at least the last three months.   You cannot be eligible for state or federal sponsored [place], including [employer], [place], or [employer].   You generally cannot be eligible for healthcare insurance through your employer.    How to apply: Eligibility screenings are held every Tuesday and Wednesday afternoon from 1:00 pm until 4:00 pm. You do not need an appointment for the interview!  [facility] [phone]  [place], [place], [place] [phone]   [facility] Health Department  [phone]   [facility] Health Department  [phone]   [facility] Health Department  [phone]    Behavioral Health Resources in the Community: Intensive Outpatient Programs Organization         Address  Phone  Notes  [facility] [address] [place], [place] [phone]   [facility] Outpatient [address] [place], [place] [phone]   [facility] [address], [place], [place]  [phone]   [facility] Mental Health [address]  [place], [place] [phone] or [phone]   Substance Abuse Resources Organization         Address  Phone  Notes  [facility]  [phone]   [facility]  [phone]   [facility]  [phone]   [place]ene Flock  [phone]   Residential & Outpatient Substance Abuse Program  [phone]   Psychological Services Organization         Address  Phone  Notes  [facility]  [phone]   [facility]  [phone]   [facility] [address] [place] [phone] or [phone]    Mobile Crisis Teams Organization         Address  Phone  Notes  [facility], Mobile Crisis Care Unit  [phone]   [facility]  [address] [place], [place] [phone]   Doristine Locks [address] [place] [phone]    Self-Help/Support Groups Organization         Address  Phone             Notes  [facility] [place] - variety of support groups  [phone]- [phone] Call for more information  Narcotics Anonymous (NA), [facility] [address], [employer] [place]  2 meetings at this location   Statistician         Address  Phone  Notes  [facility] [address],    [place]  [phone]   [facility]  [address] [place] [address], [place], [place] [phone]   [facility] [address] [place], [place] [phone] Admissions: 8am-3pm M-F  [facility] [address],    [place], [place] [phone]   [facility] [address] [place], [place], [place] [phone]   [facility] [address]  [place], [place] [phone]   [facility] - Intensive Outpatient [address] Laurell Josephs [address], [place], [place] [phone]   [facility] ([facility] [address] [place].,  [place], [place] [phone] or [phone]   [facility] ([facility]) [address], [place], [place] [phone] Accepts Medicaid  [facility] [address],  [place] [phone] Substance Abuse/Addiction Treatment   [facility] Organization         Address  Phone  Notes  [facility]  [phone]   Angie Fava, PhD [address] Ervin Knack [place], [place]   [phone] or [phone]   [facility]   [address] [place], [place] [phone]   [facility] [address] [place], [place] [phone] Insurance/Medicaid/sponsorship through [employer] and Families [address]  [place], [place] [phone] [place], [place] [phone]    Dr. Adele Schilder  [phone]   [facility] Dept. 1) [address] [place] 2) [place] 3)  [place] [address], [place] [phone]  [phone]   [place] [phone] or [phone] (After Hours)

## 2015-04-30 NOTE — ED Provider Notes (Signed)
CSN: [id number]     Arrival date & time 04/30/15  1537 History   First MD Initiated Contact with Patient 04/30/15 1547     Chief Complaint  Patient presents with  . Otalgia  . Nasal Congestion     (Consider location/radiation/quality/duration/timing/severity/associated sxs/prior Treatment) The history is provided by the patient and medical records. No language interpreter was used.     Christina Glass is a 49 y.o. female  with a hx of anxiety, neurofibromatosis presents to the Emergency Department complaining of gradual, persistent, progressively worsening right ear otalgia with associated nasal congestion and nonproductive cough onset 1 week ago. Associated symptoms include rhinorrhea, or throat.  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 Patient reports no treatment until this morning after which she took a Benadryl but this did not help her symptoms. Nothing makes her symptoms better or worse this time. She denies fever, chills, headache, neck pain, neck stiffness, chest pain, shortness of breath, abdominal pain, nausea, vomiting, diarrhea weakness, dizziness, syncope.     Past Medical History  Diagnosis Date  . Anxiety   . Neurofibromatosis   . Bronchitis    Past Surgical History  Procedure Laterality Date  . Leg surgery    . Abdominal hysterectomy     No family history on file. Social History  Substance Use Topics  . Smoking status: Never Smoker   . Smokeless tobacco: None  . Alcohol Use: No   OB History    No data available     Review of Systems  Constitutional: Negative for fever, chills, appetite change and fatigue.  HENT: Positive for congestion, ear pain, postnasal drip, rhinorrhea, sinus pressure and sore throat. Negative for ear discharge and mouth sores.   Eyes: Negative for visual disturbance.  Respiratory: Negative for cough, chest tightness, shortness of breath, wheezing and stridor.   Cardiovascular: Negative  for chest pain, palpitations and leg swelling.  Gastrointestinal: Negative for nausea, vomiting, abdominal pain and diarrhea.  Genitourinary: Negative for dysuria, urgency, frequency and hematuria.  Musculoskeletal: Negative for myalgias, back pain, arthralgias and neck stiffness.  Skin: Negative for rash.  Neurological: Negative for syncope, light-headedness, numbness and headaches.  Hematological: Negative for adenopathy.  Psychiatric/Behavioral: The patient is not nervous/anxious.   All other systems reviewed and are negative.     Allergies  Ivp dye; Fish oil; and Shrimp  Home Medications   Prior to Admission medications   Medication Sig Start Date End Date Taking? Authorizing Provider  amoxicillin (AMOXIL) 500 MG capsule Take 1 capsule (500 mg total) by mouth 3 (three) times daily. 04/30/15   Nikoletta Varma, PA-C  benzonatate (TESSALON) 100 MG capsule Take 2 capsules (200 mg total) by mouth 2 (two) times daily as needed for cough. 04/30/15   Keelia Graybill, PA-C  fluticasone (FLONASE) 50 MCG/ACT nasal spray Place 2 sprays into both nostrils daily. 04/30/15   Kwanza Cancelliere, PA-C  ondansetron (ZOFRAN) 4 MG tablet Take 1 tablet (4 mg total) by mouth every 8 (eight) hours as needed for nausea or vomiting. 01/01/15   Courteney Lyn Mackuen, MD   BP 125/85 mmHg  Pulse 92  Temp(Src) 98.1 F (36.7 C) (Oral)  Resp 18  Ht  (1.6 m)  Wt 111.585 kg  BMI 43.59 kg/m2  SpO2 98% Physical Exam  Constitutional: She is oriented to person, place, and time. She appears well-developed and well-nourished. No distress.  Awake, alert, nontoxic appearance  HENT:  Head: Normocephalic and  atraumatic.  Right Ear: External ear normal.  Left Ear: Tympanic membrane, external ear and ear canal normal.  Nose: Mucosal edema and rhinorrhea present. No epistaxis. Right sinus exhibits no maxillary sinus tenderness and no frontal sinus tenderness. Left sinus exhibits no maxillary sinus  tenderness and no frontal sinus tenderness.  Mouth/Throat: Uvula is midline, oropharynx is clear and moist and mucous membranes are normal. Mucous membranes are not pale and not cyanotic. No oropharyngeal exudate, posterior oropharyngeal edema, posterior oropharyngeal erythema or tonsillar abscesses.  Right cerumen impaction  Eyes: Conjunctivae are normal. Pupils are equal, round, and reactive to light. No scleral icterus.  Neck: Normal range of motion and full passive range of motion without pain. Neck supple.  Cardiovascular: Normal rate, regular rhythm, normal heart sounds and intact distal pulses.   Pulmonary/Chest: Effort normal and breath sounds normal. No stridor. No respiratory distress. She has no wheezes.  Clear and equal breath sounds without focal wheezes, rhonchi, rales  Abdominal: Soft. Bowel sounds are normal. She exhibits no mass. There is no tenderness. There is no rebound and no guarding.  Musculoskeletal: Normal range of motion. She exhibits no edema.  Lymphadenopathy:    She has no cervical adenopathy.  Neurological: She is alert and oriented to person, place, and time.  Speech is clear and goal oriented Moves extremities without ataxia  Skin: Skin is warm and dry. No rash noted. She is not diaphoretic.  Psychiatric: She has a normal mood and affect.  Nursing note and vitals reviewed.   ED Course  .Ear Cerumen Removal Date/Time: 04/30/2015 4:11 PM Performed by: Dierdre ForthMUTHERSBAUGH, Rogue Rafalski Authorized by: Dierdre ForthMUTHERSBAUGH, Koji Niehoff Consent: Verbal consent obtained. Risks and benefits: risks, benefits and alternatives were discussed Consent given by: patient Patient understanding: patient states understanding of the procedure being performed Patient consent: the patient's understanding of the procedure matches consent given Procedure consent: procedure consent matches procedure scheduled Relevant documents: relevant documents present and verified Site marked: the operative site  was marked Required items: required blood products, implants, devices, and special equipment available Patient identity confirmed: verbally with patient and arm band Time out: Immediately prior to procedure a "time out" was called to verify the correct patient, procedure, equipment, support staff and site/side marked as required. Local anesthetic: none Ceruminolytics applied: Ceruminolytics applied prior to the procedure. Location details: right ear Procedure type: curette and irrigation Patient sedated: no Patient tolerance: Patient tolerated the procedure well with no immediate complications   (including critical care time) Labs Review Labs Reviewed - No data to display  Imaging Review No results found. I have personally reviewed and evaluated these images and lab results as part of my medical decision-making.   EKG Interpretation None      MDM   Final diagnoses:  Acute right otitis media, recurrence not specified, unspecified otitis media type  Viral URI with cough   Christina Glass presents with right otalgia and URI symptoms. Patient URI is likely viral. Lung sounds are clear and equal and she is afebrile. No indication for chest x-ray this time. Highly doubt pneumonia.  Right ear otalgia with cerumen impaction. After cerumen removal patient with clinical right otitis media, bulging TM with purulent effusion.  Will begin on amoxicillin.  Pt will be discharged with symptomatic treatment.  Verbalizes understanding and is agreeable with plan. Pt is hemodynamically stable & in NAD prior to dc.  BP 125/85 mmHg  Pulse 92  Temp(Src) 98.1 F (36.7 C) (Oral)  Resp 18  Ht 5\' 3"  (1.6 m)  Wt 111.585 kg  BMI 43.59 kg/m2  SpO2 98%    Dierdre Forth, PA-C 04/30/15 1700  Laurence Spates, MD 05/01/15 [phone]

## 2017-07-17 ENCOUNTER — Emergency Department (HOSPITAL_BASED_OUTPATIENT_CLINIC_OR_DEPARTMENT_OTHER)
Admission: EM | Admit: 2017-07-17 | Discharge: 2017-07-17 | Disposition: A | Discharge status: Home / Self Care | Payer: Self-pay | Attending: Emergency Medicine | Admitting: Emergency Medicine

## 2017-07-17 ENCOUNTER — Encounter (HOSPITAL_BASED_OUTPATIENT_CLINIC_OR_DEPARTMENT_OTHER): Payer: Self-pay | Admitting: *Deleted

## 2017-07-17 ENCOUNTER — Other Ambulatory Visit: Payer: Self-pay

## 2017-07-17 DIAGNOSIS — A599 Trichomoniasis, unspecified: Secondary | ICD-10-CM | POA: Insufficient documentation

## 2017-07-17 LAB — URINALYSIS, MICROSCOPIC (REFLEX)

## 2017-07-17 LAB — URINALYSIS, ROUTINE W REFLEX MICROSCOPIC
BILIRUBIN URINE: NEGATIVE
Glucose, UA: NEGATIVE mg/dL
KETONES UR: 40 mg/dL — AB
NITRITE: POSITIVE — AB
Protein, ur: NEGATIVE mg/dL
SPECIFIC GRAVITY, URINE: 1.025 (ref 1.005–1.030)
pH: 6 (ref 5.0–8.0)

## 2017-07-17 LAB — WET PREP, GENITAL
Clue Cells Wet Prep HPF POC: NONE SEEN
SPERM: NONE SEEN
Yeast Wet Prep HPF POC: NONE SEEN

## 2017-07-17 MED ORDER — CEFTRIAXONE SODIUM 250 MG IJ SOLR
250.0000 mg | Freq: Once | INTRAMUSCULAR | Status: AC
  Administered 2017-07-17: 250 mg via INTRAMUSCULAR
  Filled 2017-07-17: qty 250

## 2017-07-17 MED ORDER — METRONIDAZOLE 500 MG PO TABS
2000.0000 mg | ORAL_TABLET | Freq: Once | ORAL | Status: AC
  Administered 2017-07-17: 2000 mg via ORAL
  Filled 2017-07-17: qty 4

## 2017-07-17 MED ORDER — AZITHROMYCIN 250 MG PO TABS
1000.0000 mg | ORAL_TABLET | Freq: Once | ORAL | Status: AC
  Administered 2017-07-17: 1000 mg via ORAL
  Filled 2017-07-17: qty 4

## 2017-07-17 NOTE — Discharge Instructions (Signed)

## 2017-07-17 NOTE — ED Provider Notes (Signed)
Emergency Department Provider Note   I have reviewed the triage vital signs and the nursing notes.   HISTORY  Chief Complaint Vaginal Discharge   HPI Christina Glass is a 52 y.o. 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 She recently had unprotected sex with her ex-husband and since that time has developed copious, foul-smelling discharge. Some noted dysuria. No abdominal pr back pain. No vaginal discharge. No fever/chills. No modifying factors.   Past Medical History:  Diagnosis Date  . Anxiety   . Bronchitis   . Neurofibromatosis     There are no active problems to display for this patient.   Past Surgical History:  Procedure Laterality Date  . ABDOMINAL HYSTERECTOMY    . LEG SURGERY      Current Outpatient Rx  . Order #: [id number] Class: Print  . Order #: [id number] Class: Print  . Order #: [id number] Class: Print    Allergies Ivp dye [iodinated diagnostic agents]; Fish oil; and Shrimp [shellfish allergy]  No family history on file.  Social History Social History   Tobacco Use  . Smoking status: Never Smoker  Substance Use Topics  . Alcohol use: No  . Drug use: No    Review of Systems  Constitutional: No fever/chills Eyes: No visual changes. ENT: No sore throat. Cardiovascular: Denies chest pain. Respiratory: Denies shortness of breath. Gastrointestinal: No abdominal pain.  No nausea, no vomiting.  No diarrhea.  No constipation. Genitourinary: Negative for dysuria. Positive vaginal discharge.  Musculoskeletal: Negative for back pain. Skin: Negative for rash. Neurological: Negative for headaches, focal weakness or numbness.  10-point ROS otherwise negative.  ____________________________________________   PHYSICAL EXAM:  VITAL SIGNS: ED Triage Vitals  Enc Vitals Group     BP 07/17/17 1815 130/77     Pulse Rate 07/17/17 1815 89     Resp 07/17/17 1815 16     Temp 07/17/17 1815 98 F  (36.7 C)     Temp src --      SpO2 07/17/17 1815 100 %     Weight 07/17/17 1813 230 lb (104.3 kg)     Height 07/17/17 1813 5\' 2"  (1.575 m)     Pain Score 07/17/17 1813 9   Constitutional: Alert and oriented. Well appearing and in no acute distress. Eyes: Conjunctivae are normal.  Head: Atraumatic. Nose: No congestion/rhinnorhea. Mouth/Throat: Mucous membranes are moist. Neck: No stridor.  Cardiovascular: Normal rate, regular rhythm. Good peripheral circulation. Grossly normal heart sounds.   Respiratory: Normal respiratory effort.  No retractions. Lungs CTAB. Gastrointestinal: Soft and nontender. No distention. GU exam performed with nurse chaperone. Copious foul-smelling discharge on pelvic exam. No CMT or adnexal tenderness.  Musculoskeletal: No lower extremity tenderness nor edema. No gross deformities of extremities. Neurologic:  Normal speech and language. No gross focal neurologic deficits are appreciated.  Skin:  Skin is warm, dry and intact. Multiple nodules over face, arms, and legs.    ____________________________________________   LABS (all labs ordered are listed, but only abnormal results are displayed)  Labs Reviewed  WET PREP, GENITAL - Abnormal; Notable for the following components:      Result Value   Trich, Wet Prep PRESENT (*)    WBC, Wet Prep HPF POC MANY (*)    All other components within normal limits  URINALYSIS, ROUTINE W REFLEX MICROSCOPIC - Abnormal; Notable for the following components:   APPearance CLOUDY (*)    Hgb urine dipstick SMALL (*)    Ketones, ur  40 (*)    Nitrite POSITIVE (*)    Leukocytes, UA LARGE (*)    All other components within normal limits  URINALYSIS, MICROSCOPIC (REFLEX) - Abnormal; Notable for the following components:   Bacteria, UA MANY (*)    Squamous Epithelial / LPF 0-5 (*)    All other components within normal limits  GC/CHLAMYDIA PROBE AMP ([facility]    ____________________________________________  RADIOLOGY  None ____________________________________________   PROCEDURES  Procedure(s) performed:   Procedures  None ____________________________________________   INITIAL IMPRESSION / ASSESSMENT AND PLAN / ED COURSE  Pertinent labs & imaging results that were available during my care of the patient were reviewed by me and considered in my medical decision making (see chart for details).  Patient presents to the ED with vaginal discharge and dysuria. Pelvic exam shows copious discharge that is positive for trich. Discussed results with patient who has alerted her ex-husband by text. Will treat with abx for trich and gonorrhea/chlamydia as well. No PID concern after exam.  At this time, I do not feel there is any life-threatening condition present. I have reviewed and discussed all results (EKG, imaging, lab, urine as appropriate), exam findings with patient. I have reviewed nursing notes and appropriate previous records.  I feel the patient is safe to be discharged home without further emergent workup. Discussed usual and customary return precautions. Patient and family (if present) verbalize understanding and are comfortable with this plan.  Patient will follow-up with their primary care provider. If they do not have a primary care provider, information for follow-up has been provided to them. All questions have been answered.    ____________________________________________  FINAL CLINICAL IMPRESSION(S) / ED DIAGNOSES  Final diagnoses:  Trichimoniasis     MEDICATIONS GIVEN DURING THIS VISIT:  Medications  metroNIDAZOLE (FLAGYL) tablet 2,000 mg (2,000 mg Oral Given 07/17/17 2119)  azithromycin (ZITHROMAX) tablet 1,000 mg (1,000 mg Oral Given 07/17/17 2119)  cefTRIAXone (ROCEPHIN) injection 250 mg (250 mg Intramuscular Given 07/17/17 2121)    Note:  This document was prepared using Dragon voice recognition software and may  include unintentional dictation errors.  Alona BeneJoshua Verl Kitson, MD Emergency Medicine    Emilyanne Mcgough, Arlyss RepressJoshua G, MD 07/18/17 1250

## 2017-07-17 NOTE — ED Triage Notes (Signed)
Pt c/o vaginal discharge and pain x 1 week

## 2017-07-17 NOTE — ED Notes (Signed)
Pt verbalizes understanding of d/c instructions and denies any further needs at this time. 

## 2017-07-18 LAB — GC/CHLAMYDIA PROBE AMP (~~LOC~~) NOT AT ARMC
Chlamydia: NEGATIVE
Neisseria Gonorrhea: NEGATIVE

## 2022-07-24 ENCOUNTER — Emergency Department
Admission: EM | Admit: 2022-07-24 | Discharge: 2022-07-24 | Disposition: A | Discharge status: Home / Self Care | Payer: Commercial Managed Care - HMO | Attending: Internal Medicine | Admitting: Internal Medicine

## 2022-07-24 DIAGNOSIS — M62838 Other muscle spasm: Secondary | ICD-10-CM

## 2022-07-24 DIAGNOSIS — M62830 Muscle spasm of back: Secondary | ICD-10-CM | POA: Insufficient documentation

## 2022-07-24 HISTORY — DX: Essential (primary) hypertension: I10

## 2022-07-24 MED ORDER — OXYCODONE-ACETAMINOPHEN 5-325 MG PO TABS
1.0000 | ORAL_TABLET | Freq: Once | ORAL | Status: AC
Start: 2022-07-24 — End: 2022-07-24
  Administered 2022-07-24: 1 via ORAL
  Filled 2022-07-24: qty 1

## 2022-07-24 MED ORDER — METHOCARBAMOL 750 MG PO TABS
750.0000 mg | ORAL_TABLET | Freq: Three times a day (TID) | ORAL | 0 refills | Status: AC | PRN
Start: 2022-07-24 — End: ?

## 2022-07-24 MED ORDER — OXYCODONE-ACETAMINOPHEN 5-325 MG PO TABS
1.0000 | ORAL_TABLET | ORAL | 0 refills | Status: AC | PRN
Start: 2022-07-24 — End: ?

## 2022-07-24 MED ORDER — KETOROLAC TROMETHAMINE 30 MG/ML IJ SOLN
30.0000 mg | Freq: Once | INTRAMUSCULAR | Status: AC
Start: 2022-07-24 — End: 2022-07-24
  Administered 2022-07-24: 30 mg via INTRAMUSCULAR
  Filled 2022-07-24: qty 1

## 2022-07-24 MED ORDER — METHOCARBAMOL 500 MG PO TABS
750.0000 mg | ORAL_TABLET | Freq: Once | ORAL | Status: AC
Start: 2022-07-24 — End: 2022-07-24
  Administered 2022-07-24: 750 mg via ORAL
  Filled 2022-07-24: qty 2

## 2022-07-24 NOTE — ED Triage Notes (Signed)
Jennifer Hawkins is a 57 y.o. female who presents to ED with c/o muscle spasms to right back and neck that radiates down her right arm since yesterday. Denies known falls or injuries. Minimal relief with tylenol, last dose was yesterday. Speaking in full sentences at time of triage, no meds taken PTA.     BP 142/86   Pulse 82   Temp 98.1 F (36.7 C) (Oral)   Resp 16   Ht 5' (1.524 m)   Wt 88.9 kg   SpO2 96%   BMI 38.28 kg/m

## 2022-07-24 NOTE — ED Triage Notes (Signed)
[facility] EMERGENCY DEPARTMENT  Provider in Triage Note        Patient Name: Jennifer Hawkins    Chief Complaint: No chief complaint on file.      HPI: Jennifer Hawkins is a 57 y.o. female, who has had a rapid medical screening evaluation initiated by myself. R sided muscle spasms to neck and thoracic area since yesterday. Reports pain is much worse with neck turning and feels it is very difficult to turn to the right or raise R arm. States some lumbar pain yesterday but none today. No radiating pain, paresthesias or weakness to BUE/BLE. No saddle anesthesia. No loss of bowel bladder function. No injury or fall. States she sits at her computer all day and thinks it may be from her sitting position       LMP: hysterectomy  Tylenol at 12 pm yesterday.   Medical/Surgical/Social history: as per HPI    Vitals: BP 142/86   Pulse 82   Temp 98.1 F (36.7 C) (Oral)   Resp 16   SpO2 96%     Pertinent brief exam:   Examination of area of concern: tenderness to R trapezius area     Preliminary orders: none at this time     Symptom based preliminary diagnosis/MDM: neck and thoracic pain     Patient advised to remain in the ED until further evaluation can be performed. Patient instructed to notify staff of any changes in condition while waiting.  This assessment is an initial evaluation to expedite care.

## 2022-07-24 NOTE — Discharge Instructions (Signed)
Tylenol (acetaminophen)  over the counter as directed.     Percocet (oxycodone '5mg'$  -acetaminophen '325mg'$ ) for severe pain.  Because Percocet has '325mg'$  of Tylenol (acetaminophen) do not take more than '650mg'$  of additional Tylenol concurrently.  Do not take more than a total of 1 gram of tylenol (acetaminophen) every 6 hours.  This medication can be constipating.  Please drink plenty of water and consider taking over the counter stool softeners such as Colace.  Do not drive for at least 6 hours from last dose.     Robaxin (methocarbamol) is a muscle relaxer and may be used alone or in combination with Percocet but can increase sedation.  Do not drive for at least 8 hours from last dose.    You may apply heat to the area for 10-15 minutes every 3-4 hours.     YOU SHOULD SEEK MEDICAL ATTENTION IMMEDIATELY, EITHER HERE OR AT THE NEAREST EMERGENCY DEPARTMENT, IF ANY OF THE FOLLOWING OCCURS:  You think the pain is coming from somewhere other than your neck or back. This can include pelvic pain. This can be from infections in the pelvis or lower belly.   You have abdominal (belly) pain that goes through to your back.   Your arms or legs tingle or get numb (lose feeling).   Your arms or legs are weak.   You have fever (temperature higher than 100.40F / 38C) along with neck or back pain.   Your neck or back pain is getting worse.   You lose control of your bladder or bowels. If this were to happen, it may cause you to wet or soil yourself.   You have problems urinating (peeing).    Follow up with orthopedic spinal specialist, Dr Catha Gosselin, in 1 week if not improved.      Stretching routine that can be helpful to release muscle tension:    Begin by rolling your shoulders backwards and downwards 10 times. Next, squeeze your shoulder blades together 10 times.  Then, cup your hands behind base of your head and use weight of hands to apply steady pressure forward steadily for 30 seconds then turn your gaze towards each breast pocket  repeating steady forward pressure. Lastly, dip your head to the side, reaching ear to the shoulder holding for 30 seconds.

## 2022-07-24 NOTE — ED Provider Notes (Cosign Needed)
EMERGENCY DEPARTMENT HISTORY AND PHYSICAL EXAM    Date: 07/24/2022  Patient Name: Jennifer Hawkins  Attending Physician: Herschel Senegal, MD  Mid-level: Janetta Hora PA-C    History of Presenting Illness    None          Personal Protective Equipment (PPE)    Gloves and Procedural Mask       Chief Complaint   Patient presents with    Back Pain    Neck Pain    Arm Pain       History Provided By: Patient     Chief Complaint: R sided neck pain/spasms radiating to medial border of R scapula   Onset: yesterday  Timing: gradual worsening. Intermittent flares of severe spasm  Location: R side of neck and R trapezius  Quality: sharp/spasm   Severity: 8/10  Modifying Factors: worse with neck rotation/movement. No medications taken today.     Additional History: Jennifer Hawkins is a 57 y.o. female with PMH of HTN presents with R sided muscle spasms to neck and R thoracic area since yesterday. Reports pain is much worse with neck turning and feels it is very difficult to turn to the right or raise R arm. States some lumbar pain yesterday but none today. No radiating pain, paresthesias or weakness to BUE/BLE. No saddle anesthesia. No loss of bowel bladder function. No injury or fall. States she sits at her computer all day and thinks it may be from her sitting position or perhaps sleeping position. Denies fever, CP, SOB.      LMP: hysterectomy  Tylenol at 12 pm yesterday.     PCP: Pcp, None, MD    No current facility-administered medications for this encounter.    Current Outpatient Medications:     methocarbamol (ROBAXIN) 750 MG tablet, Take 1 tablet (750 mg) by mouth 3 (three) times daily as needed (muscle spasm), Disp: 15 tablet, Rfl: 0    oxyCODONE-acetaminophen (PERCOCET) 5-325 MG per tablet, Take 1 tablet by mouth every 4 (four) hours as needed for Pain, Disp: 6 tablet, Rfl: 0    The review of the patient's previously prescribed medications does not in any way constitute an endorsement by this clinician of their  use, dosage, indications, route, efficacy, interactions, or other clinical parameters.  Past Medical History     Past Medical History:   Diagnosis Date    Hypertension      History reviewed. No pertinent surgical history.    Family History     History reviewed. No pertinent family history.    Social History     Social History     Tobacco Use    Smoking status: Unknown       Allergies     Allergies   Allergen Reactions    Shrimp [Shellfish Allergy]          Past Medical History, Past Surgical History, Family History, Social History, Allergies reviewed and pertinent elements documented, if relevant to the case, otherwise noncontributory.    Review of Systems     Review of Systems   Constitutional: Negative.    HENT: Negative.     Respiratory: Negative.  Negative for cough and shortness of breath.    Cardiovascular: Negative.  Negative for chest pain.   Gastrointestinal: Negative.    Musculoskeletal:  Positive for neck pain. Negative for back pain.        No radiating pain, paresthesias or weakness to BUE/BLE. No saddle anesthesia. No loss of bowel bladder  function.     Skin: Negative.  Negative for rash.   Neurological: Negative.  Negative for dizziness and headaches.       Physical Exam   BP 142/86   Pulse 82   Temp 98.1 F (36.7 C) (Oral)   Resp 16   Ht 5' (1.524 m)   Wt 88.9 kg   SpO2 96%   BMI 38.28 kg/m       CONSTITUTIONAL: Well-developed, well-nourished. Alert, cooperative and in   Mild pain distress. Vital signs reviewed.   HEAD: Normocephalic, atraumatic.  EYES: PERRL. EOM's intact. Conjunctiva clear. No discharge or tearing.   ENT: Normal nares without rhinorrhea. TM's clear bilaterally no hemotymp. Pharynx   visualized without erythema, tonsillar edema or exudates. Uvula midline.  NECK: R paraspinous/trapezius tenderness.  No midline Cspine tenderness. No deformity or step-off.  45 degrees of neck rotation bilaterally with severe pain with R neck rotation.  BUE DTR's normal bilaterally.  Normal BUE  grips 5/5 and equal. Normal sensation to back of head/neck. Strength 5/5 to bilateral lateral shoulder raise, flexion/extension of elbow, flex/extend wrist, flex/extend fingers and finger spread.   RESPIRATORY: Lungs CTA bilaterally.  No rib/sternal/chest wall tenderness.    CARDIAC: RRR. Normal S1 and S2 without murmurs, rubs or gallops.   GI: Soft and non-tender throughout. Non-distended, normal bowel sounds, no   organomegaly, no peritoneal signs. No ecchymosis.  MS: Moves all 4 extremities without reserve.     No midline LSpine tenderness. No deformity or step-off.  SKIN: Warm and dry.  No abrasions or breaks in skin.  NEURO: A&O x 3. CN II-XII intact.  Gait steady and balanced.   PSYCH: Normal affect, judgment, insight, memory.      Diagnostic Study Results     Labs -     Results       ** No results found for the last 24 hours. **            Radiologic Studies -   Radiology Results (24 Hour)       ** No results found for the last 24 hours. **        .    Clinical Course in the Emergency Department     Consultations:      Re-evaluations:      Procedures:      Medical Decision Making   I am the first provider for this patient.    I reviewed the vital signs, available nursing notes, past medical history, past surgical history, family history and social history.    Vital Signs-Reviewed the patient's vital signs.   Patient Vitals for the past 12 hrs:   BP Temp Pulse Resp   07/24/22 1056 142/86 98.1 F (36.7 C) 82 16       Pulse Oximetry Interpretation: Normal SpO2: 96 % on RA    Provider's Notes:    Medical Decision Making  This patient presents with R sided neck pain/spasm that is likely musculoskeletal in origin. Based on my history and examination, the differential diagnoses of infection, trauma, malignancy, ACS, have been considered and rejected. Patient with normal HSNE of cervical spine with muscular tenderness and pain provoked with neck rotation. Patient treated in ED with IM Toradol and oral Percocet and  Robaxin. No serious etiologies of neck pain are discernible at this time. The patient's symptoms will be managed with symptomatic care. I advised the patient to return to the emergency department immediately, should they develop worsening pain, chest pain, SOB,  fever, weakness, bowel or bladder symptoms, or any concerns.        Problems Addressed:  Cervical paraspinous muscle spasm: acute illness or injury    Risk  OTC drugs.  Prescription drug management.  Risk Details: Medications  ketorolac (TORADOL) injection 30 mg (30 mg Intramuscular Given 07/24/22 1637)  methocarbamol (ROBAXIN) tablet 750 mg (750 mg Oral Given 07/24/22 1637)  oxyCODONE-acetaminophen (PERCOCET) 5-325 MG per tablet 1 tablet (1 tablet Oral Given 07/24/22 1637)  Discharge Prescriptions     Medication Sig Dispense Auth. Provider    oxyCODONE-acetaminophen (PERCOCET) 5-325 MG per tablet Take 1 tablet by   mouth every 4 (four) hours as needed for Pain 6 tablet [place] Pounders   R, [place]    methocarbamol (ROBAXIN) 750 MG tablet Take 1 tablet (750 mg) by mouth 3   (three) times daily as needed (muscle spasm) 15 tablet Janetta Hora   R, [place]       I have independently evaluated this patient.     Counseled on diagnosis, f/u plans, medication use, home/self care, and signs and symptoms when to return to ED. Discussed diagnostic uncertainty and possibility of progression of illness. All questions from patient solicited and addressed. Pt expresses understanding, is stable, and amenable to discharge.     Diagnosis and Treatment Plan       Clinical Impression:   1. Cervical paraspinous muscle spasm               Wells Guiles, [place]  07/24/22 1921
